# Patient Record
Sex: Male | Born: 1965 | Race: White | Hispanic: No | Smoking: Former smoker
Health system: Southern US, Community
[De-identification: ages and names within clinical notes are randomized; demographics above are authoritative.]

## PROBLEM LIST (undated history)

## (undated) DIAGNOSIS — F1010 Alcohol abuse, uncomplicated: Secondary | ICD-10-CM

## (undated) DIAGNOSIS — I10 Essential (primary) hypertension: Secondary | ICD-10-CM

## (undated) DIAGNOSIS — N183 Chronic kidney disease, stage 3 unspecified: Secondary | ICD-10-CM

## (undated) DIAGNOSIS — I25119 Atherosclerotic heart disease of native coronary artery with unspecified angina pectoris: Secondary | ICD-10-CM

## (undated) DIAGNOSIS — E118 Type 2 diabetes mellitus with unspecified complications: Secondary | ICD-10-CM

## (undated) DIAGNOSIS — I350 Nonrheumatic aortic (valve) stenosis: Secondary | ICD-10-CM

## (undated) DIAGNOSIS — I255 Ischemic cardiomyopathy: Secondary | ICD-10-CM

## (undated) DIAGNOSIS — Z951 Presence of aortocoronary bypass graft: Secondary | ICD-10-CM

## (undated) DIAGNOSIS — I70219 Atherosclerosis of native arteries of extremities with intermittent claudication, unspecified extremity: Secondary | ICD-10-CM

## (undated) DIAGNOSIS — E785 Hyperlipidemia, unspecified: Secondary | ICD-10-CM

## (undated) DIAGNOSIS — E119 Type 2 diabetes mellitus without complications: Secondary | ICD-10-CM

## (undated) HISTORY — DX: Type 2 diabetes mellitus with unspecified complications: E11.8

## (undated) HISTORY — PX: NASAL FRACTURE SURGERY: SHX718

## (undated) HISTORY — PX: BACK SURGERY: SHX140

## (undated) HISTORY — PX: CORONARY ARTERY BYPASS GRAFT: SHX141

## (undated) HISTORY — DX: Atherosclerotic heart disease of native coronary artery with unspecified angina pectoris: I25.119

## (undated) HISTORY — DX: Chronic kidney disease, stage 3 (moderate): N18.3

## (undated) HISTORY — DX: Atherosclerosis of native arteries of extremities with intermittent claudication, unspecified extremity: I70.219

## (undated) HISTORY — PX: TONSILLECTOMY: SUR1361

## (undated) HISTORY — DX: Ischemic cardiomyopathy: I25.5

## (undated) HISTORY — DX: Chronic kidney disease, stage 3 unspecified: N18.30

## (undated) HISTORY — DX: Hyperlipidemia, unspecified: E78.5

## (undated) HISTORY — DX: Presence of aortocoronary bypass graft: Z95.1

## (undated) HISTORY — PX: WISDOM TOOTH EXTRACTION: SHX21

## (undated) HISTORY — DX: Nonrheumatic aortic (valve) stenosis: I35.0

## (undated) HISTORY — PX: COLONOSCOPY: SHX174

## (undated) HISTORY — DX: Essential (primary) hypertension: I10

## (undated) HISTORY — DX: Alcohol abuse, uncomplicated: F10.10

---

## 2002-03-31 ENCOUNTER — Emergency Department (HOSPITAL_COMMUNITY): Admission: EM | Admit: 2002-03-31 | Discharge: 2002-04-01 | Payer: Self-pay | Admitting: Emergency Medicine

## 2002-04-01 ENCOUNTER — Encounter: Payer: Self-pay | Admitting: Emergency Medicine

## 2005-12-06 ENCOUNTER — Emergency Department: Payer: Self-pay

## 2006-07-16 ENCOUNTER — Ambulatory Visit: Payer: Self-pay | Admitting: Pain Medicine

## 2006-07-25 ENCOUNTER — Ambulatory Visit: Payer: Self-pay | Admitting: Pain Medicine

## 2006-07-26 ENCOUNTER — Ambulatory Visit: Payer: Self-pay | Admitting: Pain Medicine

## 2006-08-09 ENCOUNTER — Encounter: Payer: Self-pay | Admitting: Pain Medicine

## 2006-08-13 ENCOUNTER — Ambulatory Visit: Payer: Self-pay | Admitting: Pain Medicine

## 2006-08-24 ENCOUNTER — Encounter: Payer: Self-pay | Admitting: Pain Medicine

## 2006-09-04 ENCOUNTER — Ambulatory Visit: Payer: Self-pay | Admitting: Pain Medicine

## 2006-11-18 ENCOUNTER — Ambulatory Visit: Payer: Self-pay

## 2010-08-09 ENCOUNTER — Ambulatory Visit: Payer: Self-pay | Admitting: Cardiology

## 2011-10-23 ENCOUNTER — Ambulatory Visit: Payer: Self-pay | Admitting: Pain Medicine

## 2011-11-07 ENCOUNTER — Ambulatory Visit: Payer: Self-pay | Admitting: Pain Medicine

## 2011-11-22 ENCOUNTER — Ambulatory Visit: Payer: Self-pay | Admitting: Pain Medicine

## 2013-05-17 ENCOUNTER — Emergency Department: Payer: Self-pay | Admitting: Emergency Medicine

## 2013-06-19 ENCOUNTER — Other Ambulatory Visit: Payer: Self-pay | Admitting: Allergy

## 2013-06-19 ENCOUNTER — Ambulatory Visit
Admission: RE | Admit: 2013-06-19 | Discharge: 2013-06-19 | Disposition: A | Discharge status: Home / Self Care | Payer: 59 | Source: Ambulatory Visit | Attending: Allergy | Admitting: Allergy

## 2013-06-19 DIAGNOSIS — T783XXA Angioneurotic edema, initial encounter: Secondary | ICD-10-CM

## 2013-06-23 DIAGNOSIS — Z951 Presence of aortocoronary bypass graft: Secondary | ICD-10-CM | POA: Insufficient documentation

## 2013-06-23 DIAGNOSIS — E785 Hyperlipidemia, unspecified: Secondary | ICD-10-CM | POA: Insufficient documentation

## 2013-11-26 ENCOUNTER — Ambulatory Visit: Payer: Self-pay | Admitting: Pain Medicine

## 2013-12-01 ENCOUNTER — Ambulatory Visit: Payer: Self-pay | Admitting: Pain Medicine

## 2013-12-10 ENCOUNTER — Ambulatory Visit: Payer: Self-pay | Admitting: Pain Medicine

## 2013-12-26 DIAGNOSIS — E118 Type 2 diabetes mellitus with unspecified complications: Secondary | ICD-10-CM | POA: Insufficient documentation

## 2013-12-26 DIAGNOSIS — R0683 Snoring: Secondary | ICD-10-CM | POA: Insufficient documentation

## 2013-12-26 DIAGNOSIS — I25119 Atherosclerotic heart disease of native coronary artery with unspecified angina pectoris: Secondary | ICD-10-CM | POA: Insufficient documentation

## 2013-12-26 DIAGNOSIS — I2510 Atherosclerotic heart disease of native coronary artery without angina pectoris: Secondary | ICD-10-CM | POA: Insufficient documentation

## 2013-12-26 DIAGNOSIS — F1010 Alcohol abuse, uncomplicated: Secondary | ICD-10-CM | POA: Insufficient documentation

## 2014-03-30 ENCOUNTER — Ambulatory Visit: Payer: Self-pay | Admitting: Unknown Physician Specialty

## 2014-05-18 LAB — SURGICAL PATHOLOGY

## 2015-09-23 DIAGNOSIS — I70219 Atherosclerosis of native arteries of extremities with intermittent claudication, unspecified extremity: Secondary | ICD-10-CM | POA: Insufficient documentation

## 2015-09-23 DIAGNOSIS — Z0000 Encounter for general adult medical examination without abnormal findings: Secondary | ICD-10-CM | POA: Insufficient documentation

## 2017-09-05 DIAGNOSIS — I255 Ischemic cardiomyopathy: Secondary | ICD-10-CM | POA: Insufficient documentation

## 2017-09-05 DIAGNOSIS — I350 Nonrheumatic aortic (valve) stenosis: Secondary | ICD-10-CM | POA: Insufficient documentation

## 2018-03-06 ENCOUNTER — Other Ambulatory Visit: Payer: Self-pay | Admitting: Internal Medicine

## 2018-03-06 DIAGNOSIS — I10 Essential (primary) hypertension: Secondary | ICD-10-CM

## 2018-03-06 DIAGNOSIS — N183 Chronic kidney disease, stage 3 unspecified: Secondary | ICD-10-CM | POA: Insufficient documentation

## 2018-03-19 ENCOUNTER — Ambulatory Visit
Admission: RE | Admit: 2018-03-19 | Discharge: 2018-03-19 | Disposition: A | Discharge status: Home / Self Care | Payer: 59 | Source: Ambulatory Visit | Attending: Internal Medicine | Admitting: Internal Medicine

## 2018-03-19 DIAGNOSIS — I10 Essential (primary) hypertension: Secondary | ICD-10-CM | POA: Diagnosis present

## 2018-04-02 ENCOUNTER — Other Ambulatory Visit: Payer: Self-pay

## 2018-04-02 ENCOUNTER — Encounter (INDEPENDENT_AMBULATORY_CARE_PROVIDER_SITE_OTHER): Payer: Self-pay | Admitting: Vascular Surgery

## 2018-04-02 ENCOUNTER — Ambulatory Visit (INDEPENDENT_AMBULATORY_CARE_PROVIDER_SITE_OTHER): Payer: 59 | Admitting: Vascular Surgery

## 2018-04-02 VITALS — BP 150/66 | HR 56 | Resp 10 | Ht 72.0 in | Wt 234.0 lb

## 2018-04-02 DIAGNOSIS — Z87891 Personal history of nicotine dependence: Secondary | ICD-10-CM

## 2018-04-02 DIAGNOSIS — Z79899 Other long term (current) drug therapy: Secondary | ICD-10-CM

## 2018-04-02 DIAGNOSIS — E1122 Type 2 diabetes mellitus with diabetic chronic kidney disease: Secondary | ICD-10-CM | POA: Diagnosis not present

## 2018-04-02 DIAGNOSIS — E785 Hyperlipidemia, unspecified: Secondary | ICD-10-CM | POA: Diagnosis not present

## 2018-04-02 DIAGNOSIS — N183 Chronic kidney disease, stage 3 unspecified: Secondary | ICD-10-CM

## 2018-04-02 DIAGNOSIS — I701 Atherosclerosis of renal artery: Secondary | ICD-10-CM | POA: Insufficient documentation

## 2018-04-02 DIAGNOSIS — E119 Type 2 diabetes mellitus without complications: Secondary | ICD-10-CM | POA: Insufficient documentation

## 2018-04-02 DIAGNOSIS — I129 Hypertensive chronic kidney disease with stage 1 through stage 4 chronic kidney disease, or unspecified chronic kidney disease: Secondary | ICD-10-CM | POA: Diagnosis not present

## 2018-04-02 DIAGNOSIS — I10 Essential (primary) hypertension: Secondary | ICD-10-CM | POA: Insufficient documentation

## 2018-04-02 NOTE — Assessment & Plan Note (Signed)
The findings on this duplex were of borderline elevated velocities in the right renal artery consistent with a possible moderate right renal artery stenosis with more normal velocities in the left renal artery.   We had a long conversation today about the natural history and pathophysiology of renal artery stenosis.  There had been suggestion of getting an MRA or a CT angiogram but these would be costly studies that would not change our clinical course at this point.  He sounds like he is mildly symptomatic from his renal artery stenosis, and is certainly not critical by the previous duplex findings.  I have some concern about the accuracy of that duplex.  He is not really interested in any intervention at current which is reasonable given the subcritical nature of the lesion and his blood pressure and renal function being reasonably well controlled.  I have asked him to keep a blood pressure diary.  I have discussed the importance of blood pressure control and maintaining renal function.  I will plan to see the patient back in 3 to 4 months with a duplex for follow-up of his renal artery stenosis to assess for progression as well as to continue to monitor the disease.  The patient voices his understanding and is agreeable with our plan of care.

## 2018-04-02 NOTE — Patient Instructions (Signed)
Renal Artery Stenosis Renal artery stenosis (RAS) is a narrowing of the artery that carries blood to the kidneys. It can affect one or both kidneys. The kidneys filter waste and extra fluid from the blood. Waste and fluid are then removed when a person passes urine. The kidneys also make an important chemical messenger (hormone) called renin. Renin helps regulate blood pressure. The first sign of RAS may be high blood pressure. Other symptoms can develop over time. What are the causes? A common cause of this condition is plaque buildup in your arteries (atherosclerosis). The plaques that cause this are made up of:  Fat.  Cholesterol.  Calcium.  Other substances. As these substances build up in your renal artery, the blood supply to your kidneys slows. The lack of blood and oxygen causes the signs and symptoms of RAS. A much less common cause of RAS is a disease called fibromuscular dysplasia. This disease causes abnormal cell growth that narrows the renal artery. It is not related to atherosclerosis. It occurs mostly in women who are 25-50 years old. It may be passed down through families.  What increases the risk? You are more likely to develop this condition if you:  Are a man who is at least 55 years old.  Are a woman who is at least 55 years old.  Have high blood pressure.  Have high cholesterol.  Are a smoker.  Abuse alcohol.  Have diabetes or prediabetes.  Are overweight or obese.  Have a family history of early heart disease. What are the signs or symptoms? RAS usually develops slowly. You may not have any signs or symptoms at first. Early signs may include:  Development of high blood pressure.  A sudden increase in existing high blood pressure.  No longer responding to medicine that used to control your blood pressure. Later signs and symptoms are due to kidney damage. They may include:  Feeling tired (fatigue).  Shortness of breath.  Swollen legs and  feet.  Dry skin.  Headaches.  Muscle cramps.  Loss of appetite.  Nausea or vomiting. How is this diagnosed? This condition may be diagnosed based on:  Your symptoms and medical history. Your health care provider may suspect RAS based on changes in your blood pressure and your risk factors.  A physical exam. During the exam, your health care provider will use a stethoscope to listen for a whooshing sound (bruit) that can occur where the renal artery is blocking blood flow.  Various tests. These may include: ? Blood and urine tests to check your kidney function. ? Imaging tests of your kidneys, such as:  A test that uses sound waves to create an image of your kidneys and the blood flow to your kidneys (ultrasound).  A test in which dye is injected into one of your blood vessels so images can be taken as the dye flows through your renal arteries (angiogram). This can be done using X-rays, a CT scan (computed tomography angiogram, CTA), or a type of MRI (magnetic resonance angiogram, MRA). How is this treated? Making lifestyle changes to reduce your risk factors is the first treatment option for early RAS. If the blood flow to one of your kidneys is cut by more than half, you may need medicine to:  Lower your blood pressure. This is the main medical treatment for RAS. You may need more than one type of medicine for this. The types that work best for people with RAS are: ? ACE inhibitors. ? Angiotensin receptor   blockers.  Reduce fluid in the body (diuretics).  Lower your cholesterol (statins). If medicine is not enough to control RAS, you may need surgery. This may involve:  Threading a tube with an inflatable balloon into the renal artery to force it to open (angioplasty).  Removing plaque from inside the artery (endarterectomy). Follow these instructions at home:  Lifestyle  Make any lifestyle changes recommended by your health care provider. This may include: ? Working with  a dietitian to maintain a heart-healthy diet. This type of diet is low in saturated fat, salt, and added sugar. ? Starting an exercise program as directed by your health care provider. ? Maintaining a healthy weight. ? Quitting smoking. ? Not abusing alcohol. General instructions  Take over-the-counter and prescription medicines only as told by your health care provider.  Keep all follow-up visits as told by your health care provider. This is important. Contact a health care provider if:  Your symptoms of RAS are not getting better.  Your symptoms are changing or getting worse. Get help right away if you have:  Very bad pain in your back or abdomen.  Blood in your urine. Summary  Renal artery stenosis (RAS) is a narrowing of the artery that carries blood to the kidneys. It can affect one or both kidneys.  RAS usually develops slowly. You may not have any signs or symptoms at first, but high blood pressure that is difficult to control is a key symptom.  Making lifestyle changes to reduce your risk factors is the first treatment option for early RAS. If the blood flow to one of your kidneys is cut by more than half, you may need medicines to help manage your cholesterol and blood pressure. This information is not intended to replace advice given to you by your health care provider. Make sure you discuss any questions you have with your health care provider. Document Released: 10/05/2004 Document Revised: 02/05/2017 Document Reviewed: 02/05/2017 Elsevier Interactive Patient Education  2019 [employer]  

## 2018-04-02 NOTE — Assessment & Plan Note (Signed)
lipid control important in reducing the progression of atherosclerotic disease. Continue statin therapy  

## 2018-04-02 NOTE — Assessment & Plan Note (Signed)
blood glucose control important in reducing the progression of atherosclerotic disease. Also, involved in wound healing. On appropriate medications.  

## 2018-04-02 NOTE — Progress Notes (Signed)
Patient ID: Charles Ibarra, male   DOB: 05-23-1965, 53 y.o.   MRN: [id number]  Chief Complaint  Patient presents with  . New Patient (Initial Visit)    HPI Charles Ibarra is a 53 y.o. male.  I am asked to see the patient by Dr. Ouida Sills for evaluation of renal artery stenosis.  He has been noted to have some mild chronic kidney disease in the stage II-III range as well as some blood pressure with suboptimal control.  He says his blood pressure is not markedly elevated but has been somewhat elevated.  Today it is 932 systolic.  There was a notice of some worsening creatinine after losartan which was 1 of the medicines used for his blood pressure previously.  He has a long history of heart disease and multiple medical issues as listed below.  This prompted a renal artery duplex done at the hospital which has not previously done renal artery duplex and the patient says the technologist was very frustrated and had a difficult time doing the study.  The findings on this duplex were of borderline elevated velocities in the right renal artery consistent with a possible moderate right renal artery stenosis with more normal velocities in the left renal artery.  With this, the patient is referred for further evaluation and treatment   Past Medical History:  Diagnosis Date  . Alcohol abuse   . Aortic stenosis   . Atherosclerotic PVD with intermittent claudication ([place])   . Chronic kidney disease, stage 3 (moderate) (HCC)   . Controlled type 2 diabetes mellitus with complication, without long-term current use of insulin ([place])   . Coronary artery disease involving native coronary artery of native heart with angina pectoris ([place])   . HTN (hypertension)   . Hyperlipidemia   . Ischemic cardiomyopathy   . S/P CABG (coronary artery bypass graft)     Past Surgical History Coronary artery bypass grafting   Family History No bleeding disorders, clotting disorders, porphyria, or aneurysms.  Social  History Social History   Tobacco Use  . Smoking status: Former Smoker    Last attempt to quit: 04/02/2010    Years since quitting: 8.0  . Smokeless tobacco: Never Used  Substance Use Topics  . Alcohol use: Yes    Alcohol/week: 13.0 standard drinks    Types: 13 Cans of beer per week  . Drug use: Not Currently    No Known Allergies  Current Outpatient Medications  Medication Sig Dispense Refill  . aspirin EC 81 MG tablet Take 81 mg by mouth daily.    Marland Kitchen atorvastatin (LIPITOR) 80 MG tablet Take 80 mg by mouth daily.    . carvedilol (COREG) 12.5 MG tablet Take 12.5 mg by mouth 2 (two) times daily with a meal.    . levocetirizine (XYZAL) 5 MG tablet Take 5 mg by mouth every evening.    Marland Kitchen losartan (COZAAR) 100 MG tablet Take 100 mg by mouth daily.    Marland Kitchen torsemide (DEMADEX) 20 MG tablet Take 20 mg by mouth daily.     No current facility-administered medications for this visit.       REVIEW OF SYSTEMS (Negative unless checked)  Constitutional: [] Weight loss  [] Fever  [] Chills Cardiac: [] Chest pain   [] Chest pressure   [] Palpitations   [] Shortness of breath when laying flat   [] Shortness of breath at rest   [x] Shortness of breath with exertion. Vascular:  [] Pain in legs with walking   [] Pain in legs at rest   []   Pain in legs when laying flat   [x] Claudication   [] Pain in feet when walking  [] Pain in feet at rest  [] Pain in feet when laying flat   [] History of DVT   [] Phlebitis   [] Swelling in legs   [] Varicose veins   [] Non-healing ulcers Pulmonary:   [] Uses home oxygen   [] Productive cough   [] Hemoptysis   [] Wheeze  [] COPD   [] Asthma Neurologic:  [] Dizziness  [] Blackouts   [] Seizures   [] History of stroke   [] History of TIA  [] Aphasia   [] Temporary blindness   [] Dysphagia   [] Weakness or numbness in arms   [] Weakness or numbness in legs Musculoskeletal:  [] Arthritis   [] Joint swelling   [] Joint pain   [] Low back pain Hematologic:  [] Easy bruising  [] Easy bleeding   [] Hypercoagulable state    [] Anemic  [] Hepatitis Gastrointestinal:  [] Blood in stool   [] Vomiting blood  [] Gastroesophageal reflux/heartburn   [] Abdominal pain Genitourinary:  [x] Chronic kidney disease   [] Difficult urination  [] Frequent urination  [] Burning with urination   [] Hematuria Skin:  [] Rashes   [] Ulcers   [] Wounds Psychological:  [] History of anxiety   []  History of major depression.    Physical Exam BP (!) 150/66 (BP Location: Left Arm, Patient Position: Sitting, Cuff Size: Large)   Pulse (!) 56   Resp 10   Ht 6' (1.829 m)   Wt 234 lb (106.1 kg)   BMI 31.74 kg/m  Gen:  WD/WN, NAD Head: [place]/AT, No temporalis wasting.  Ear/Nose/Throat: Hearing grossly intact, nares w/o erythema or drainage, oropharynx w/o Erythema/Exudate Eyes: Conjunctiva clear, sclera non-icteric  Neck: trachea midline.  No JVD.  Pulmonary:  Good air movement, respirations not labored, no use of accessory muscles  Cardiac: RRR, no JVD Vascular:  Vessel Right Left  Radial Palpable Palpable                                   Gastrointestinal:. No masses, surgical incisions, or scars.  No abdominal bruit Musculoskeletal: M/S 5/5 throughout.  Extremities without ischemic changes.  No deformity or atrophy. No edema. Neurologic: Sensation grossly intact in extremities.  Symmetrical.  Speech is fluent. Motor exam as listed above. Psychiatric: Judgment intact, Mood & affect appropriate for pt's clinical situation. Dermatologic: No rashes or ulcers noted.  No cellulitis or open wounds.    Radiology US Renal Artery Duplex Complete  Result Date: 03/19/2018 CLINICAL DATA:  Hypertension, diabetes EXAM: RENAL/URINARY TRACT ULTRASOUND RENAL DUPLEX DOPPLER ULTRASOUND COMPARISON:  None. FINDINGS: Right Kidney: Length: 10.2 cm. Echogenicity within normal limits. No mass or hydronephrosis visualized. Left Kidney: Length: 10.6 cm. Echogenicity within normal limits. No mass or hydronephrosis visualized. Bladder:  Unremarkable RENAL DUPLEX  ULTRASOUND Right Renal Artery Velocities: Origin:  168 cm/sec Mid:  200 cm/sec [place]:  203 cm/sec Interlobar:  50 cm/sec [place]:  40 cm/sec Left Renal Artery Velocities: Origin:  65 cm/sec Mid:  94 cm/sec [place]:  139 cm/sec Interlobar:  65 cm/sec Arcuate:  36 cm/sec Aortic Velocity:  91 cm/sec Right Renal-Aortic Ratios: Origin: 1.8 Mid:  2.1 [place]: 2.2 Interlobar: 0.6 Arcuate: 0.4 Left Renal-Aortic Ratios: Origin: 0.2 Mid: 1.0 [place]: 1.5 Interlobar: 0.7 Arcuate: 0.4 IMPRESSION: 1. Elevated peak systolic velocities in the right renal artery suggesting stenosis of possible hemodynamic significance. If there is continued clinical concern, renal MRA (lower radiation risk, can be performed noncontrast in the setting of renal dysfunction) and CTA ( higher spatial resolution) represent  more accurate studies, which are additionally more sensitive to the detection of duplicated renal arteries. Electronically Signed   By: Lucrezia Europe M.D.   On: 03/19/2018 13:12    Labs No results found for this or any previous visit (from the past 2160 hour(s)).  Assessment/Plan:  Hyperlipidemia lipid control important in reducing the progression of atherosclerotic disease. Continue statin therapy   Essential hypertension blood pressure control important in reducing the progression of atherosclerotic disease. On appropriate oral medications.   Diabetes ([place]) blood glucose control important in reducing the progression of atherosclerotic disease. Also, involved in wound healing. On appropriate medications.   Renal artery stenosis (HCC) The findings on this duplex were of borderline elevated velocities in the right renal artery consistent with a possible moderate right renal artery stenosis with more normal velocities in the left renal artery.   We had a long conversation today about the natural history and pathophysiology of renal artery stenosis.  There had been suggestion of getting an MRA or a CT angiogram but these  would be costly studies that would not change our clinical course at this point.  He sounds like he is mildly symptomatic from his renal artery stenosis, and is certainly not critical by the previous duplex findings.  I have some concern about the accuracy of that duplex.  He is not really interested in any intervention at current which is reasonable given the subcritical nature of the lesion and his blood pressure and renal function being reasonably well controlled.  I have asked him to keep a blood pressure diary.  I have discussed the importance of blood pressure control and maintaining renal function.  I will plan to see the patient back in 3 to 4 months with a duplex for follow-up of his renal artery stenosis to assess for progression as well as to continue to monitor the disease.  The patient voices his understanding and is agreeable with our plan of care.      Leotis Pain 04/02/2018, 11:01 AM   This note was created with Dragon medical transcription system.  Any errors from dictation are unintentional.

## 2018-04-02 NOTE — Assessment & Plan Note (Signed)
blood pressure control important in reducing the progression of atherosclerotic disease. On appropriate oral medications.  

## 2018-08-02 ENCOUNTER — Other Ambulatory Visit: Payer: Self-pay

## 2018-08-02 ENCOUNTER — Ambulatory Visit (INDEPENDENT_AMBULATORY_CARE_PROVIDER_SITE_OTHER): Payer: 59

## 2018-08-02 ENCOUNTER — Encounter (INDEPENDENT_AMBULATORY_CARE_PROVIDER_SITE_OTHER): Payer: Self-pay | Admitting: Vascular Surgery

## 2018-08-02 ENCOUNTER — Encounter (INDEPENDENT_AMBULATORY_CARE_PROVIDER_SITE_OTHER): Payer: Self-pay

## 2018-08-02 ENCOUNTER — Ambulatory Visit (INDEPENDENT_AMBULATORY_CARE_PROVIDER_SITE_OTHER): Payer: 59 | Admitting: Vascular Surgery

## 2018-08-02 VITALS — BP 151/78 | HR 49 | Resp 12 | Ht 72.0 in | Wt 231.0 lb

## 2018-08-02 DIAGNOSIS — Z87891 Personal history of nicotine dependence: Secondary | ICD-10-CM

## 2018-08-02 DIAGNOSIS — Z79899 Other long term (current) drug therapy: Secondary | ICD-10-CM

## 2018-08-02 DIAGNOSIS — I701 Atherosclerosis of renal artery: Secondary | ICD-10-CM

## 2018-08-02 DIAGNOSIS — I10 Essential (primary) hypertension: Secondary | ICD-10-CM

## 2018-08-02 DIAGNOSIS — E785 Hyperlipidemia, unspecified: Secondary | ICD-10-CM

## 2018-08-02 DIAGNOSIS — E1122 Type 2 diabetes mellitus with diabetic chronic kidney disease: Secondary | ICD-10-CM

## 2018-08-02 DIAGNOSIS — N183 Chronic kidney disease, stage 3 unspecified: Secondary | ICD-10-CM

## 2018-08-02 DIAGNOSIS — I120 Hypertensive chronic kidney disease with stage 5 chronic kidney disease or end stage renal disease: Secondary | ICD-10-CM

## 2018-08-02 NOTE — Assessment & Plan Note (Signed)
His duplex today shows normal velocities and flow in the right renal artery with moderately elevated velocities consistent with a likely greater than 60% stenosis in the left renal artery. He has pretty good blood pressure control on 2 medications.  At this time, he has what would appear to be at least a moderate left renal artery stenosis but not severe symptoms.  I think it would be reasonable to continue to monitor this and try to manage it medically.  Should he develop worsening renal insufficiency or worsening hypertension, consideration for intervention would be given.  I will plan to see him back in 6 months.

## 2018-08-02 NOTE — Progress Notes (Signed)
MRN : [id number]  Charles Ibarra is a 53 y.o. (1965-07-29) male who presents with chief complaint of  Chief Complaint  Patient presents with  . Follow-up    ultrasound  .  History of Present Illness: Patient returns today in follow up of his renal artery stenosis.  He brings his blood pressure diary and by enlarge his blood pressures are pretty good mostly in the 130s over 70s.  There are occasional pressures in the 140s with a couple in the low 150s.  He says his renal function was within one-point of normal at its last check and he has no other complaints today.  His duplex today shows normal velocities and flow in the right renal artery with moderately elevated velocities consistent with a likely greater than 60% stenosis in the left renal artery.  Current Outpatient Medications  Medication Sig Dispense Refill  . aspirin EC 81 MG tablet Take 81 mg by mouth daily.    Marland Kitchen atorvastatin (LIPITOR) 80 MG tablet Take 80 mg by mouth daily.    . carvedilol (COREG) 12.5 MG tablet Take 12.5 mg by mouth 2 (two) times daily with a meal.    . EPINEPHrine (EPIPEN 2-PAK) 0.3 mg/0.3 mL IJ SOAJ injection     . levocetirizine (XYZAL) 5 MG tablet Take 5 mg by mouth every evening.    Marland Kitchen losartan (COZAAR) 100 MG tablet Take 100 mg by mouth daily.    . nitroGLYCERIN (NITROSTAT) 0.4 MG SL tablet Place under the tongue.    . torsemide (DEMADEX) 20 MG tablet Take 20 mg by mouth daily.     No current facility-administered medications for this visit.     Past Medical History:  Diagnosis Date  . Alcohol abuse   . Aortic stenosis   . Atherosclerotic PVD with intermittent claudication ([place])   . Chronic kidney disease, stage 3 (moderate) (HCC)   . Controlled type 2 diabetes mellitus with complication, without long-term current use of insulin ([place])   . Coronary artery disease involving native coronary artery of native heart with angina pectoris ([place])   . HTN (hypertension)   . Hyperlipidemia   . Ischemic  cardiomyopathy   . S/P CABG (coronary artery bypass graft)    Past Surgical History Coronary artery bypass grafting   Family History No bleeding disorders, clotting disorders, porphyria, or aneurysms.  Social History Social History        Tobacco Use  . Smoking status: Former Smoker    Last attempt to quit: 04/02/2010    Years since quitting: 8.0  . Smokeless tobacco: Never Used  Substance Use Topics  . Alcohol use: Yes    Alcohol/week: 13.0 standard drinks    Types: 13 Cans of beer per week  . Drug use: Not Currently    No Known Allergies        Current Outpatient Medications  Medication Sig Dispense Refill  . aspirin EC 81 MG tablet Take 81 mg by mouth daily.    Marland Kitchen atorvastatin (LIPITOR) 80 MG tablet Take 80 mg by mouth daily.    . carvedilol (COREG) 12.5 MG tablet Take 12.5 mg by mouth 2 (two) times daily with a meal.    . levocetirizine (XYZAL) 5 MG tablet Take 5 mg by mouth every evening.    Marland Kitchen losartan (COZAAR) 100 MG tablet Take 100 mg by mouth daily.    Marland Kitchen torsemide (DEMADEX) 20 MG tablet Take 20 mg by mouth daily.     No current facility-administered  medications for this visit.       REVIEW OF SYSTEMS (Negative unless checked)  Constitutional: [] ?Weight loss  [] ?Fever  [] ?Chills Cardiac: [] ?Chest pain   [] ?Chest pressure   [] ?Palpitations   [] ?Shortness of breath when laying flat   [] ?Shortness of breath at rest   [x] ?Shortness of breath with exertion. Vascular:  [] ?Pain in legs with walking   [] ?Pain in legs at rest   [] ?Pain in legs when laying flat   [x] ?Claudication   [] ?Pain in feet when walking  [] ?Pain in feet at rest  [] ?Pain in feet when laying flat   [] ?History of DVT   [] ?Phlebitis   [] ?Swelling in legs   [] ?Varicose veins   [] ?Non-healing ulcers Pulmonary:   [] ?Uses home oxygen   [] ?Productive cough   [] ?Hemoptysis   [] ?Wheeze  [] ?COPD   [] ?Asthma Neurologic:  [] ?Dizziness  [] ?Blackouts   [] ?Seizures   [] ?History of  stroke   [] ?History of TIA  [] ?Aphasia   [] ?Temporary blindness   [] ?Dysphagia   [] ?Weakness or numbness in arms   [] ?Weakness or numbness in legs Musculoskeletal:  [] ?Arthritis   [] ?Joint swelling   [] ?Joint pain   [] ?Low back pain Hematologic:  [] ?Easy bruising  [] ?Easy bleeding   [] ?Hypercoagulable state   [] ?Anemic  [] ?Hepatitis Gastrointestinal:  [] ?Blood in stool   [] ?Vomiting blood  [] ?Gastroesophageal reflux/heartburn   [] ?Abdominal pain Genitourinary:  [x] ?Chronic kidney disease   [] ?Difficult urination  [] ?Frequent urination  [] ?Burning with urination   [] ?Hematuria Skin:  [] ?Rashes   [] ?Ulcers   [] ?Wounds Psychological:  [] ?History of anxiety   [] ? History of major depression.   Physical Examination  BP (!) 151/78   Pulse (!) 49   Resp 12   Ht 6' (1.829 m)   Wt 231 lb (104.8 kg)   BMI 31.33 kg/m  Gen:  WD/WN, NAD Head: [place]/AT, No temporalis wasting. Ear/Nose/Throat: Hearing grossly intact, nares w/o erythema or drainage Eyes: Conjunctiva clear. Sclera non-icteric Neck: Supple.  Trachea midline Pulmonary:  Good air movement, no use of accessory muscles.  Cardiac: RRR, no JVD Vascular:  Vessel Right Left  Radial Palpable Palpable                          PT Palpable Palpable  DP Palpable Palpable   Gastrointestinal: soft, non-tender/non-distended. No guarding/reflex.  Musculoskeletal: M/S 5/5 throughout.  No deformity or atrophy. No edema. Neurologic: Sensation grossly intact in extremities.  Symmetrical.  Speech is fluent.  Psychiatric: Judgment intact, Mood & affect appropriate for pt's clinical situation. Dermatologic: No rashes or ulcers noted.  No cellulitis or open wounds.       Labs No results found for this or any previous visit (from the past 2160 hour(s)).  Radiology No results found.  Assessment/Plan Hyperlipidemia lipid control important in reducing the progression of atherosclerotic disease. Continue statin therapy   Essential  hypertension blood pressure control important in reducing the progression of atherosclerotic disease. On appropriate oral medications.   Diabetes ([place]) blood glucose control important in reducing the progression of atherosclerotic disease. Also, involved in wound healing. On appropriate medications.  Renal artery stenosis (HCC) His duplex today shows normal velocities and flow in the right renal artery with moderately elevated velocities consistent with a likely greater than 60% stenosis in the left renal artery. He has pretty good blood pressure control on 2 medications.  At this time, he has what would appear to be at least a moderate left renal artery stenosis but not severe  symptoms.  I think it would be reasonable to continue to monitor this and try to manage it medically.  Should he develop worsening renal insufficiency or worsening hypertension, consideration for intervention would be given.  I will plan to see him back in 6 months.    Leotis Pain, MD  08/02/2018 10:38 AM    This note was created with Dragon medical transcription system.  Any errors from dictation are purely unintentional

## 2018-10-07 DIAGNOSIS — I701 Atherosclerosis of renal artery: Secondary | ICD-10-CM | POA: Insufficient documentation

## 2019-02-04 ENCOUNTER — Ambulatory Visit (INDEPENDENT_AMBULATORY_CARE_PROVIDER_SITE_OTHER): Payer: 59

## 2019-02-04 ENCOUNTER — Other Ambulatory Visit: Payer: Self-pay

## 2019-02-04 ENCOUNTER — Ambulatory Visit (INDEPENDENT_AMBULATORY_CARE_PROVIDER_SITE_OTHER): Payer: 59 | Admitting: Vascular Surgery

## 2019-02-04 ENCOUNTER — Encounter (INDEPENDENT_AMBULATORY_CARE_PROVIDER_SITE_OTHER): Payer: Self-pay

## 2019-02-04 ENCOUNTER — Encounter (INDEPENDENT_AMBULATORY_CARE_PROVIDER_SITE_OTHER): Payer: Self-pay | Admitting: Vascular Surgery

## 2019-02-04 VITALS — BP 158/80 | HR 60 | Resp 19 | Ht 72.0 in | Wt 235.0 lb

## 2019-02-04 DIAGNOSIS — I701 Atherosclerosis of renal artery: Secondary | ICD-10-CM | POA: Insufficient documentation

## 2019-02-04 DIAGNOSIS — E1122 Type 2 diabetes mellitus with diabetic chronic kidney disease: Secondary | ICD-10-CM

## 2019-02-04 DIAGNOSIS — N183 Chronic kidney disease, stage 3 unspecified: Secondary | ICD-10-CM

## 2019-02-04 DIAGNOSIS — E785 Hyperlipidemia, unspecified: Secondary | ICD-10-CM | POA: Diagnosis not present

## 2019-02-04 DIAGNOSIS — I10 Essential (primary) hypertension: Secondary | ICD-10-CM | POA: Diagnosis not present

## 2019-02-04 NOTE — Progress Notes (Signed)
MRN : [id number]  Charles Ibarra is a 54 y.o. (25-Sep-1965) male who presents with chief complaint of  Chief Complaint  Patient presents with  . Follow-up  .  History of Present Illness: Patient returns today in follow up of his renal artery stenosis.  He says his renal function has been fine and his blood pressure control has been good.  His blood pressure is somewhat elevated today but he says this is much higher than his normal pressure.  His duplex today shows mildly elevated velocities in the right renal artery although it does not appear to be hemodynamically significant.  The renal artery velocities in the left kidney appear to be fairly normal today.  Current Outpatient Medications  Medication Sig Dispense Refill  . aspirin EC 81 MG tablet Take 81 mg by mouth daily.    Marland Kitchen atorvastatin (LIPITOR) 80 MG tablet Take 80 mg by mouth daily.    . carvedilol (COREG) 12.5 MG tablet Take 12.5 mg by mouth 2 (two) times daily with a meal.    . levocetirizine (XYZAL) 5 MG tablet Take 5 mg by mouth every evening.    Marland Kitchen losartan (COZAAR) 100 MG tablet Take 100 mg by mouth daily.    . nitroGLYCERIN (NITROSTAT) 0.4 MG SL tablet Place under the tongue.    . torsemide (DEMADEX) 20 MG tablet Take 20 mg by mouth daily.    Marland Kitchen EPINEPHrine (EPIPEN 2-PAK) 0.3 mg/0.3 mL IJ SOAJ injection      No current facility-administered medications for this visit.    Past Medical History:  Diagnosis Date  . Alcohol abuse   . Aortic stenosis   . Atherosclerotic PVD with intermittent claudication ([place])   . Chronic kidney disease, stage 3 (moderate)   . Controlled type 2 diabetes mellitus with complication, without long-term current use of insulin ([place])   . Coronary artery disease involving native coronary artery of native heart with angina pectoris ([place])   . HTN (hypertension)   . Hyperlipidemia   . Ischemic cardiomyopathy   . S/P CABG (coronary artery bypass graft)       Social History   Tobacco Use  .  Smoking status: Former Smoker    Quit date: 04/02/2010    Years since quitting: 8.8  . Smokeless tobacco: Never Used  Substance Use Topics  . Alcohol use: Yes    Alcohol/week: 13.0 standard drinks    Types: 13 Cans of beer per week  . Drug use: Not Currently    Family History No bleeding or clotting disorders  No Known Allergies   REVIEW OF SYSTEMS(Negative unless checked)  Constitutional: [] ??Weight loss[] ??Fever[] ??Chills Cardiac:[] ??Chest pain[] ??Chest pressure[] ??Palpitations [] ??Shortness of breath when laying flat [] ??Shortness of breath at rest [x] ??Shortness of breath with exertion. Vascular: [] ??Pain in legs with walking[] ??Pain in legsat rest[] ??Pain in legs when laying flat [x] ??Claudication [] ??Pain in feet when walking [] ??Pain in feet at rest [] ??Pain in feet when laying flat [] ??History of DVT [] ??Phlebitis [] ??Swelling in legs [] ??Varicose veins [] ??Non-healing ulcers Pulmonary: [] ??Uses home oxygen [] ??Productive cough[] ??Hemoptysis [] ??Wheeze [] ??COPD [] ??Asthma Neurologic: [] ??Dizziness [] ??Blackouts [] ??Seizures [] ??History of stroke [] ??History of TIA[] ??Aphasia [] ??Temporary blindness[] ??Dysphagia [] ??Weaknessor numbness in arms [] ??Weakness or numbnessin legs Musculoskeletal: [] ??Arthritis [] ??Joint swelling [] ??Joint pain [] ??Low back pain Hematologic:[] ??Easy bruising[] ??Easy bleeding [] ??Hypercoagulable state [] ??Anemic [] ??Hepatitis Gastrointestinal:[] ??Blood in stool[] ??Vomiting blood[] ??Gastroesophageal reflux/heartburn[] ??Abdominal pain Genitourinary: [x] ??Chronic kidney disease [] ??Difficulturination [] ??Frequenturination [] ??Burning with urination[] ??Hematuria Skin: [] ??Rashes [] ??Ulcers [] ??Wounds Psychological: [] ??History of anxiety[] ??History of major depression.  Physical Examination  BP (!) 158/80 (BP Location: Left Arm)    Pulse  60   Resp 19   Ht 6' (1.829 m)   Wt 235 lb (106.6 kg)   BMI 31.87 kg/m  Gen:  WD/WN, NAD Head: [place]/AT, No temporalis wasting. Ear/Nose/Throat: Hearing grossly intact, nares w/o erythema or drainage Eyes: Conjunctiva clear. Sclera non-icteric Neck: Supple.  Trachea midline Pulmonary:  Good air movement, no use of accessory muscles.  Cardiac: RRR, no JVD Vascular:  Vessel Right Left  Radial Palpable Palpable                                   Gastrointestinal: soft, non-tender/non-distended. No guarding/reflex.  Musculoskeletal: M/S 5/5 throughout.  No deformity or atrophy.  No edema. Neurologic: Sensation grossly intact in extremities.  Symmetrical.  Speech is fluent.  Psychiatric: Judgment intact, Mood & affect appropriate for pt's clinical situation. Dermatologic: No rashes or ulcers noted.  No cellulitis or open wounds.       Labs No results found for this or any previous visit (from the past 2160 hour(s)).  Radiology No results found.  Assessment/Plan Hyperlipidemia lipid control important in reducing the progression of atherosclerotic disease. Continue statin therapy   Essential hypertension blood pressure control important in reducing the progression of atherosclerotic disease. On appropriate oral medications.   Diabetes ([place]) blood glucose control important in reducing the progression of atherosclerotic disease. Also, involved in wound healing. On appropriate medications.  Renal artery stenosis (HCC) His duplex today shows mildly elevated velocities in the right renal artery although it does not appear to be hemodynamically significant.  The renal artery velocities in the left kidney appear to be fairly normal today. For his mild, subcritical renal artery stenosis no intervention would currently be recommended.  We will now follow this on an annual basis unless he has worsening clinical symptoms in the interim.    Leotis Pain, MD  02/04/2019  11:11 AM    This note was created with Dragon medical transcription system.  Any errors from dictation are purely unintentional

## 2019-02-04 NOTE — Patient Instructions (Signed)
Renal Artery Stenosis Renal artery stenosis (RAS) is a narrowing of the artery that carries blood to the kidneys. It can affect one or both kidneys. The kidneys filter waste and extra fluid from the blood. Waste and fluid are then removed when a person passes urine. The kidneys also make an important chemical messenger (hormone) called renin. Renin helps regulate blood pressure. The first sign of RAS may be high blood pressure. Other symptoms can develop over time. What are the causes? A common cause of this condition is plaque buildup in your arteries (atherosclerosis). The plaques that cause this are made up of:  Fat.  Cholesterol.  Calcium.  Other substances. As these substances build up in your renal artery, the blood supply to your kidneys slows. The lack of blood and oxygen causes the signs and symptoms of RAS. A much less common cause of RAS is a disease called fibromuscular dysplasia. This disease causes abnormal cell growth that narrows the renal artery. It is not related to atherosclerosis. It occurs mostly in women who are 25-50 years old. It may be passed down through families.  What increases the risk? You are more likely to develop this condition if you:  Are a man who is at least 56 years old.  Are a woman who is at least 55 years old.  Have high blood pressure.  Have high cholesterol.  Are a smoker.  Abuse alcohol.  Have diabetes or prediabetes.  Are overweight or obese.  Have a family history of early heart disease. What are the signs or symptoms? RAS usually develops slowly. You may not have any signs or symptoms at first. Early signs may include:  Development of high blood pressure.  A sudden increase in existing high blood pressure.  No longer responding to medicine that used to control your blood pressure. Later signs and symptoms are due to kidney damage. They may include:  Feeling tired (fatigue).  Shortness of breath.  Swollen legs and  feet.  Dry skin.  Headaches.  Muscle cramps.  Loss of appetite.  Nausea or vomiting. How is this diagnosed? This condition may be diagnosed based on:  Your symptoms and medical history. Your health care provider may suspect RAS based on changes in your blood pressure and your risk factors.  A physical exam. During the exam, your health care provider will use a stethoscope to listen for a whooshing sound (bruit) that can occur where the renal artery is blocking blood flow.  Various tests. These may include: ? Blood and urine tests to check your kidney function. ? Imaging tests of your kidneys, such as:  A test that uses sound waves to create an image of your kidneys and the blood flow to your kidneys (ultrasound).  A test in which dye is injected into one of your blood vessels so images can be taken as the dye flows through your renal arteries (angiogram). This can be done using X-rays, a CT scan (computed tomography angiogram, CTA), or a type of MRI (magnetic resonance angiogram, MRA). How is this treated? Making lifestyle changes to reduce your risk factors is the first treatment option for early RAS. If the blood flow to one of your kidneys is cut by more than half, you may need medicine to:  Lower your blood pressure. This is the main medical treatment for RAS. You may need more than one type of medicine for this. The types that work best for people with RAS are: ? ACE inhibitors. ? Angiotensin receptor   blockers.  Reduce fluid in the body (diuretics).  Lower your cholesterol (statins). If medicine is not enough to control RAS, you may need surgery. This may involve:  Threading a tube with an inflatable balloon into the renal artery to force it to open (angioplasty).  Removing plaque from inside the artery (endarterectomy). Follow these instructions at home:  Lifestyle  Make any lifestyle changes recommended by your health care provider. This may include: ? Working with  a dietitian to maintain a heart-healthy diet. This type of diet is low in saturated fat, salt, and added sugar. ? Starting an exercise program as directed by your health care provider. ? Maintaining a healthy weight. ? Quitting smoking. ? Not abusing alcohol. General instructions  Take over-the-counter and prescription medicines only as told by your health care provider.  Keep all follow-up visits as told by your health care provider. This is important. Contact a health care provider if:  Your symptoms of RAS are not getting better.  Your symptoms are changing or getting worse. Get help right away if you have:  Very bad pain in your back or abdomen.  Blood in your urine. Summary  Renal artery stenosis (RAS) is a narrowing of the artery that carries blood to the kidneys. It can affect one or both kidneys.  RAS usually develops slowly. You may not have any signs or symptoms at first, but high blood pressure that is difficult to control is a key symptom.  Making lifestyle changes to reduce your risk factors is the first treatment option for early RAS. If the blood flow to one of your kidneys is cut by more than half, you may need medicines to help manage your cholesterol and blood pressure. This information is not intended to replace advice given to you by your health care provider. Make sure you discuss any questions you have with your health care provider. Document Revised: 02/05/2017 Document Reviewed: 02/05/2017 Elsevier Patient Education  2020 [employer]  

## 2019-02-04 NOTE — Assessment & Plan Note (Signed)
His duplex today shows mildly elevated velocities in the right renal artery although it does not appear to be hemodynamically significant.  The renal artery velocities in the left kidney appear to be fairly normal today. For his mild, subcritical renal artery stenosis no intervention would currently be recommended.  We will now follow this on an annual basis unless he has worsening clinical symptoms in the interim.

## 2019-04-15 ENCOUNTER — Other Ambulatory Visit (HOSPITAL_COMMUNITY): Payer: Self-pay | Admitting: Acute Care

## 2019-04-15 ENCOUNTER — Other Ambulatory Visit (HOSPITAL_COMMUNITY): Payer: Self-pay | Admitting: Physical Medicine & Rehabilitation

## 2019-04-15 ENCOUNTER — Other Ambulatory Visit: Payer: Self-pay | Admitting: Physical Medicine & Rehabilitation

## 2019-04-15 DIAGNOSIS — M5416 Radiculopathy, lumbar region: Secondary | ICD-10-CM

## 2019-04-19 ENCOUNTER — Ambulatory Visit: Payer: 59

## 2019-04-26 ENCOUNTER — Ambulatory Visit: Payer: 59

## 2019-05-27 DIAGNOSIS — G8929 Other chronic pain: Secondary | ICD-10-CM | POA: Insufficient documentation

## 2019-09-02 ENCOUNTER — Ambulatory Visit
Admission: RE | Admit: 2019-09-02 | Discharge: 2019-09-02 | Disposition: A | Discharge status: Home / Self Care | Payer: 59 | Source: Ambulatory Visit | Attending: Physical Medicine & Rehabilitation | Admitting: Physical Medicine & Rehabilitation

## 2019-09-02 ENCOUNTER — Other Ambulatory Visit: Payer: Self-pay

## 2019-09-02 DIAGNOSIS — M5416 Radiculopathy, lumbar region: Secondary | ICD-10-CM

## 2019-09-02 MED ORDER — GADOBENATE DIMEGLUMINE 529 MG/ML IV SOLN
20.0000 mL | Freq: Once | INTRAVENOUS | Status: AC | PRN
  Administered 2019-09-02: 20 mL via INTRAVENOUS

## 2020-02-03 ENCOUNTER — Ambulatory Visit (INDEPENDENT_AMBULATORY_CARE_PROVIDER_SITE_OTHER): Payer: 59 | Admitting: Nurse Practitioner

## 2020-02-03 ENCOUNTER — Encounter (INDEPENDENT_AMBULATORY_CARE_PROVIDER_SITE_OTHER): Payer: 59

## 2020-12-27 ENCOUNTER — Other Ambulatory Visit: Payer: Self-pay | Admitting: Ophthalmology

## 2020-12-27 DIAGNOSIS — H34232 Retinal artery branch occlusion, left eye: Secondary | ICD-10-CM

## 2020-12-29 ENCOUNTER — Other Ambulatory Visit (INDEPENDENT_AMBULATORY_CARE_PROVIDER_SITE_OTHER): Payer: Self-pay | Admitting: Ophthalmology

## 2020-12-29 DIAGNOSIS — H34232 Retinal artery branch occlusion, left eye: Secondary | ICD-10-CM

## 2020-12-31 ENCOUNTER — Ambulatory Visit (INDEPENDENT_AMBULATORY_CARE_PROVIDER_SITE_OTHER): Payer: Self-pay | Admitting: Nurse Practitioner

## 2020-12-31 ENCOUNTER — Other Ambulatory Visit: Payer: Self-pay

## 2020-12-31 ENCOUNTER — Ambulatory Visit (INDEPENDENT_AMBULATORY_CARE_PROVIDER_SITE_OTHER): Payer: Self-pay

## 2020-12-31 ENCOUNTER — Encounter (INDEPENDENT_AMBULATORY_CARE_PROVIDER_SITE_OTHER): Payer: Self-pay | Admitting: Nurse Practitioner

## 2020-12-31 ENCOUNTER — Ambulatory Visit: Payer: 59

## 2020-12-31 VITALS — BP 145/69 | HR 53 | Resp 16 | Wt 232.0 lb

## 2020-12-31 DIAGNOSIS — I6522 Occlusion and stenosis of left carotid artery: Secondary | ICD-10-CM

## 2020-12-31 DIAGNOSIS — I10 Essential (primary) hypertension: Secondary | ICD-10-CM

## 2020-12-31 DIAGNOSIS — H34232 Retinal artery branch occlusion, left eye: Secondary | ICD-10-CM

## 2020-12-31 DIAGNOSIS — N183 Chronic kidney disease, stage 3 unspecified: Secondary | ICD-10-CM

## 2020-12-31 DIAGNOSIS — E1122 Type 2 diabetes mellitus with diabetic chronic kidney disease: Secondary | ICD-10-CM

## 2020-12-31 DIAGNOSIS — E785 Hyperlipidemia, unspecified: Secondary | ICD-10-CM

## 2021-01-01 NOTE — Progress Notes (Signed)
Subjective:    Patient ID: Charles Ibarra, male    DOB: Jun 09, 1965, 55 y.o.   MRN: [id number] Chief Complaint  Patient presents with   Follow-up    Ultrasound follow up    Charles Ibarra is a 55 year old male that presents today after recent sudden vision changes.  The patient notes that on Sunday he woke up with vision changes in his left eye.  A portion of his vision was cut off.  It has not recovered.  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    There is no recent history of TIA symptoms or focal motor deficits. There is no prior documented CVA.  There is no history of migraine headaches. There is no history of seizures.  The patient is taking enteric-coated aspirin 81 mg daily.  The patient has a history of coronary artery disease, no recent episodes of angina or shortness of breath. The patient denies PAD or claudication symptoms. There is a history of hyperlipidemia which is being treated with a statin.    Today noninvasive studies show a right ICA stenosis of 1 to 39% with a left ICA stenosis of 80 to 99%.  There is also greater than 50% stenosis noted in the distal common carotid artery.  There is severe amounts of calcified irregular plaque in the ICA with significant narrowing seen.   Review of Systems  Eyes:  Positive for visual disturbance.  Neurological:  Negative for dizziness, syncope, facial asymmetry, weakness, light-headedness and headaches.  All other systems reviewed and are negative.     Objective:   Physical Exam Vitals reviewed.  HENT:     Head: Normocephalic.  Neck:     Vascular: No carotid bruit.  Cardiovascular:     Rate and Rhythm: Normal rate and regular rhythm.     Pulses: Normal pulses.  Pulmonary:     Effort: Pulmonary effort is normal.  Neurological:     Mental Status: He is alert and oriented to person, place, and time.  Psychiatric:        Mood and Affect: Mood normal.        Behavior:  Behavior normal.        Thought Content: Thought content normal.        Judgment: Judgment normal.    BP (!) 145/69 (BP Location: Left Arm)   Pulse (!) 53   Resp 16   Wt 232 lb (105.2 kg)   BMI 31.46 kg/m   Past Medical History:  Diagnosis Date   Alcohol abuse    Aortic stenosis    Atherosclerotic PVD with intermittent claudication (HCC)    Chronic kidney disease, stage 3 (moderate)    Controlled type 2 diabetes mellitus with complication, without long-term current use of insulin (HCC)    Coronary artery disease involving native coronary artery of native heart with angina pectoris (HCC)    HTN (hypertension)    Hyperlipidemia    Ischemic cardiomyopathy    S/P CABG (coronary artery bypass graft)     Social History   Socioeconomic History   Marital status: Divorced    Spouse name: Not on file   Number of children: Not on file   Years of education: Not on file   Highest education level: Not on file  Occupational History   Not on file  Tobacco Use   Smoking status: Former    Types: Cigarettes    Quit date: 04/02/2010    Years since quitting:  10.7   Smokeless tobacco: Never  Substance and Sexual Activity   Alcohol use: Yes    Alcohol/week: 13.0 standard drinks    Types: 13 Cans of beer per week   Drug use: Not Currently   Sexual activity: Not Currently  Other Topics Concern   Not on file  Social History Narrative   Not on file   Social Determinants of Health   Financial Resource Strain: Not on file  Food Insecurity: Not on file  Transportation Needs: Not on file  Physical Activity: Not on file  Stress: Not on file  Social Connections: Not on file  Intimate Partner Violence: Not on file    Past Surgical History:  Procedure Laterality Date   BACK SURGERY     COLONOSCOPY     CORONARY ARTERY BYPASS GRAFT     NASAL FRACTURE SURGERY     TONSILLECTOMY     WISDOM TOOTH EXTRACTION      History reviewed. No pertinent family history.  No Known  Allergies  No flowsheet data found.    CMP  No results found for: NA, K, CL, CO2, GLUCOSE, BUN, CREATININE, CALCIUM, PROT, ALBUMIN, AST, ALT, ALKPHOS, BILITOT, GFRNONAA, GFRAA   No results found.     Assessment & Plan:   1. Stenosis of left carotid artery The patient remains asymptomatic with respect to the carotid stenosis.  However, the patient has now progressed and has a lesion the is >70%.  Patient should undergo CT angiography of the carotid arteries to define the degree of stenosis of the internal carotid arteries bilaterally and the anatomic suitability for surgery vs. intervention.  If the patient does indeed need surgery cardiac clearance will be required, once cleared the patient will be scheduled for surgery.  The risks, benefits and alternative therapies were reviewed in detail with the patient.  All questions were answered.  The patient agrees to proceed with imaging.  Currently the patient is self-pay and he has requested imaging to be done at an outside imaging center, [facility], as requested.  Patient is advised if they are unable to complete imaging that we will need to do it at a [facility].  Continue antiplatelet therapy as prescribed. Continue management of CAD, HTN and Hyperlipidemia. Healthy heart diet, encouraged exercise at least 4 times per week.    2. Essential hypertension Continue antihypertensive medications as already ordered, these medications have been reviewed and there are no changes at this time.   3. Hyperlipidemia, unspecified hyperlipidemia type Continue statin as ordered and reviewed, no changes at this time   4. Type 2 diabetes mellitus with stage 3 chronic kidney disease, without long-term current use of insulin, unspecified whether stage 3a or 3b CKD ([place]) Continue hypoglycemic medications as already ordered, these medications have been reviewed and there are no changes at this time.  Hgb A1C to be monitored as already arranged  by primary service    Current Outpatient Medications on File Prior to Visit  Medication Sig Dispense Refill   aspirin EC 81 MG tablet Take 81 mg by mouth daily.     atorvastatin (LIPITOR) 80 MG tablet Take 80 mg by mouth daily.     carvedilol (COREG) 12.5 MG tablet Take 12.5 mg by mouth 2 (two) times daily with a meal.     EPINEPHrine 0.3 mg/0.3 mL IJ SOAJ injection      losartan (COZAAR) 100 MG tablet Take 100 mg by mouth daily.     nitroGLYCERIN (NITROSTAT) 0.4 MG SL  tablet Place under the tongue.     torsemide (DEMADEX) 20 MG tablet Take 20 mg by mouth daily.     levocetirizine (XYZAL) 5 MG tablet Take 5 mg by mouth every evening. (Patient not taking: Reported on 12/31/2020)     No current facility-administered medications on file prior to visit.    There are no Patient Instructions on file for this visit. No follow-ups on file.   Kris Hartmann, NP

## 2021-01-02 ENCOUNTER — Encounter (INDEPENDENT_AMBULATORY_CARE_PROVIDER_SITE_OTHER): Payer: Self-pay | Admitting: Nurse Practitioner

## 2021-01-06 DIAGNOSIS — H34232 Retinal artery branch occlusion, left eye: Secondary | ICD-10-CM | POA: Insufficient documentation

## 2021-01-06 DIAGNOSIS — I6522 Occlusion and stenosis of left carotid artery: Secondary | ICD-10-CM | POA: Insufficient documentation

## 2021-01-11 ENCOUNTER — Other Ambulatory Visit: Payer: Self-pay

## 2021-01-11 ENCOUNTER — Encounter (INDEPENDENT_AMBULATORY_CARE_PROVIDER_SITE_OTHER): Payer: Self-pay | Admitting: Vascular Surgery

## 2021-01-11 ENCOUNTER — Ambulatory Visit (INDEPENDENT_AMBULATORY_CARE_PROVIDER_SITE_OTHER): Payer: Self-pay | Admitting: Vascular Surgery

## 2021-01-11 VITALS — BP 124/73 | HR 60 | Ht 72.0 in | Wt 234.0 lb

## 2021-01-11 DIAGNOSIS — N183 Chronic kidney disease, stage 3 unspecified: Secondary | ICD-10-CM

## 2021-01-11 DIAGNOSIS — E785 Hyperlipidemia, unspecified: Secondary | ICD-10-CM

## 2021-01-11 DIAGNOSIS — H34232 Retinal artery branch occlusion, left eye: Secondary | ICD-10-CM

## 2021-01-11 DIAGNOSIS — I10 Essential (primary) hypertension: Secondary | ICD-10-CM

## 2021-01-11 DIAGNOSIS — I6522 Occlusion and stenosis of left carotid artery: Secondary | ICD-10-CM

## 2021-01-11 DIAGNOSIS — I701 Atherosclerosis of renal artery: Secondary | ICD-10-CM

## 2021-01-11 MED ORDER — CLOPIDOGREL BISULFATE 75 MG PO TABS: 75.0000 mg | ORAL_TABLET | Freq: Every day | ORAL | 6 refills | Status: DC

## 2021-01-11 NOTE — Assessment & Plan Note (Signed)
Represents symptoms from his severe carotid disease on the left

## 2021-01-11 NOTE — Patient Instructions (Signed)
Carotid Angioplasty With Stent Carotid angioplasty with stent is a procedure to open or widen an artery in the neck (carotid artery) that has become narrowed. This is done by inflating a small balloon inside the artery and then placing a small piece of metal that looks like a coil or spring (stent) inside the artery. The stent helps keep the artery open by supporting the artery walls. The carotid arteries supply blood to the brain. When fats, cholesterol, and other materials (plaque) build up in an artery, the artery becomes narrow and can become blocked. This can reduce or block blood flow to certain areas of the brain, which can cause serious health problems, including stroke. Tell a health care provider about: Any allergies you have. All medicines you are taking, including vitamins, herbs, eye drops, creams, and over-the-counter medicines. Any problems you or family members have had with anesthetic medicines. Any blood disorders you have. Any surgeries you have had. Any medical conditions you have. Whether you are pregnant or may be pregnant. What are the risks? Generally, this is a safe procedure. However, problems may occur, including: Infection. Bleeding. Allergic reactions to medicines or dyes. Damage to other structures or organs, or to the carotid artery itself. The carotid artery becoming blocked again. A collection of blood under the skin (hematoma) around the stent site that gets larger. A blood clot in another part of the body. Kidney injury. Stroke. Heart attack. What happens before the procedure? Ask your health care provider about: Changing or stopping your regular medicines. This is especially important if you are taking diabetes medicines or blood thinners. Whether aspirin is recommended before this procedure. Taking over-the-counter medicines, vitamins, herbs, and supplements. Follow instructions from your health care provider about eating or drinking restrictions. Do  not use any products that contain nicotine or tobacco for 4 weeks before the procedure. These products include cigarettes, e-cigarettes, and chewing tobacco. If you need help quitting, ask your health care provider. Ask your health care provider what steps will be taken to help prevent infection. These may include: Removing hair at the surgery site. Washing skin with a germ-killing soap. Taking antibiotic medicine. You may have blood tests and imaging tests done. Plan to have someone take you home from the hospital or clinic. If you will be going home right after the procedure, plan to have someone with you for 24 hours. What happens during the procedure?  An IV will be inserted into one of your veins. You may be given one or more of the following: A medicine to help you relax (sedative). A medicine to numb the area where the catheter will be inserted (local anesthetic). Most commonly, an incision will be made in your groin. In some cases, an incision may be made in your wrist or forearm instead of your groin. A small, thin tube (catheter) will be inserted through your incision, into an artery. The catheter will be threaded upward into your carotid artery. An X-ray machine (fluoroscope) will help your health care provider guide the catheter to the correct place in your artery. Dye will be injected into the catheter and will travel to the narrow or blocked part of your carotid artery. X-ray images will be taken of how the dye flows through your artery. While the images are being taken, you may be given instructions about breathing, swallowing, moving, or talking. A filter (distal protection device) will be inserted into your artery. This will be used to catch plaque that comes loose in your artery  during the procedure. This reduces the risk of plaque moving into your brain. A small balloon will be inserted into your artery. The balloon will be inflated for a few seconds to widen your artery and  will then be removed. The stent will be placed in your artery. A second small balloon will be inserted into your artery and inflated. This expands the stent inside of your artery so that the stent holds up the artery walls. The balloon will then be removed. The catheter and the distal protection device will be removed from your artery. Your incision may be closed with stitches (sutures), skin glue, or adhesive tape. A bandage (dressing) will be placed over your incision. The procedure may vary among health care providers and hospitals. What happens after the procedure? Your blood pressure, heart rate, breathing rate, and blood oxygen level will be monitored until you leave the hospital or clinic. You may continue to receive fluids and medicines through an IV. You may need to have pressure placed on the incision site to prevent bleeding. You will need to keep the area still for a few hours, or as long as directed by your health care provider. If the procedure was done in the groin, you will be instructed not to bend or cross your legs. You may have some pain. Pain medicines will be available to help you. You may have a test that uses sound waves to take pictures (ultrasound) of the carotid artery. This can be compared to future tests to check for changes in the artery. Do not drive for 24 hours. Summary Carotid angioplasty with stent is a procedure to open or widen an artery in the neck (carotid artery) that has become narrowed. The procedure is done to lower the risk of problems that can result from reduced blood flow to the brain, including a stroke. The stent placed inside the artery will help keep the artery open by supporting the artery walls. Follow instructions from your health care provider about taking medicines and about eating and drinking before the procedure. This information is not intended to replace advice given to you by your health care provider. Make sure you discuss any  questions you have with your health care provider. Document Revised: 03/23/2020 Document Reviewed: 03/23/2020 Elsevier Patient Education  [place].

## 2021-01-11 NOTE — Assessment & Plan Note (Signed)
blood pressure control important in reducing the progression of atherosclerotic disease. On appropriate oral medications.  

## 2021-01-11 NOTE — Assessment & Plan Note (Signed)
lipid control important in reducing the progression of atherosclerotic disease. Continue statin therapy  

## 2021-01-11 NOTE — Progress Notes (Signed)
MRN : [id number]  Charles Ibarra is a 55 y.o. ([birth year]) male who presents with chief complaint of  Chief Complaint  Patient presents with   Follow-up    FU from CT  .  History of Present Illness: Patient returns today in follow up of his carotid disease. He has some residual left eye vision issues after his amaurosis fugax episode a couple weeks ago. No new complaints. He has undergone a CT angiogram which I have independently reviewed.  My interpretation of the CT scan would be a stenosis in the moderate range on the right side in the 50 to 60% range.  The left side appears to be a very high-grade stenosis of clearly greater than 80 to 85%.  The vessel has only mild tortuosity and moderate calcification.  The lesion does go up near the angle of the jaw on the left.  Current Outpatient Medications  Medication Sig Dispense Refill   aspirin EC 81 MG tablet Take 81 mg by mouth daily.     atorvastatin (LIPITOR) 80 MG tablet Take 80 mg by mouth daily.     carvedilol (COREG) 12.5 MG tablet Take 12.5 mg by mouth 2 (two) times daily with a meal.     EPINEPHrine 0.3 mg/0.3 mL IJ SOAJ injection      levocetirizine (XYZAL) 5 MG tablet Take 5 mg by mouth every evening.     losartan (COZAAR) 100 MG tablet Take 100 mg by mouth daily.     nitroGLYCERIN (NITROSTAT) 0.4 MG SL tablet Place under the tongue.     torsemide (DEMADEX) 20 MG tablet Take 20 mg by mouth daily.     No current facility-administered medications for this visit.    Past Medical History:  Diagnosis Date   Alcohol abuse    Aortic stenosis    Atherosclerotic PVD with intermittent claudication (HCC)    Chronic kidney disease, stage 3 (moderate)    Controlled type 2 diabetes mellitus with complication, without long-term current use of insulin (HCC)    Coronary artery disease involving native coronary artery of native heart with angina pectoris (HCC)    HTN (hypertension)    Hyperlipidemia    Ischemic cardiomyopathy    S/P  CABG (coronary artery bypass graft)     Past Surgical History:  Procedure Laterality Date   BACK SURGERY     COLONOSCOPY     CORONARY ARTERY BYPASS GRAFT     NASAL FRACTURE SURGERY     TONSILLECTOMY     WISDOM TOOTH EXTRACTION       Social History   Tobacco Use   Smoking status: Former    Types: Cigarettes    Quit date: 04/02/2010    Years since quitting: 10.7   Smokeless tobacco: Never  Substance Use Topics   Alcohol use: Yes    Alcohol/week: 13.0 standard drinks    Types: 13 Cans of beer per week   Drug use: Not Currently      Family History No bleeding disorders, clotting disorders, autoimmune diseases, or aneurysms  No Known Allergies   REVIEW OF SYSTEMS (Negative unless checked)  Constitutional: [] Weight loss  [] Fever  [] Chills Cardiac: [] Chest pain   [] Chest pressure   [] Palpitations   [] Shortness of breath when laying flat   [] Shortness of breath at rest   [x] Shortness of breath with exertion. Vascular:  [] Pain in legs with walking   [] Pain in legs at rest   [] Pain in legs when laying flat   [x] Claudication   []   Pain in feet when walking  [] Pain in feet at rest  [] Pain in feet when laying flat   [] History of DVT   [] Phlebitis   [] Swelling in legs   [] Varicose veins   [] Non-healing ulcers Pulmonary:   [] Uses home oxygen   [] Productive cough   [] Hemoptysis   [] Wheeze  [] COPD   [] Asthma Neurologic:  [] Dizziness  [] Blackouts   [] Seizures   [] History of stroke   [] History of TIA  [] Aphasia   [x] Temporary blindness   [] Dysphagia   [] Weakness or numbness in arms   [] Weakness or numbness in legs Musculoskeletal:  [] Arthritis   [] Joint swelling   [] Joint pain   [] Low back pain Hematologic:  [] Easy bruising  [] Easy bleeding   [] Hypercoagulable state   [] Anemic   Gastrointestinal:  [] Blood in stool   [] Vomiting blood  [] Gastroesophageal reflux/heartburn   [] Abdominal pain Genitourinary:  [x] Chronic kidney disease   [] Difficult urination  [] Frequent urination  [] Burning with  urination   [] Hematuria Skin:  [] Rashes   [] Ulcers   [] Wounds Psychological:  [] History of anxiety   []  History of major depression.  Physical Examination  BP 124/73    Pulse 60    Ht 6' (1.829 m)    Wt 234 lb (106.1 kg)    BMI 31.74 kg/m  Gen:  WD/WN, NAD Head: [place]/AT, No temporalis wasting. Ear/Nose/Throat: Hearing grossly intact, nares w/o erythema or drainage Eyes: Conjunctiva clear. Sclera non-icteric Neck: Supple.  Trachea midline Pulmonary:  Good air movement, no use of accessory muscles.  Cardiac: RRR, no JVD Vascular:  Vessel Right Left  Radial Palpable Palpable           Musculoskeletal: M/S 5/5 throughout.  No deformity or atrophy. No edema. Neurologic: Sensation grossly intact in extremities.  Symmetrical.  Speech is fluent.  Psychiatric: Judgment intact, Mood & affect appropriate for pt's clinical situation. Dermatologic: No rashes or ulcers noted.  No cellulitis or open wounds.      Labs No results found for this or any previous visit (from the past 2160 hour(s)).  Radiology VAS US CAROTID  Result Date: 01/05/2021 Carotid Arterial Duplex Study Patient Name:  Charles Ibarra  Date of Exam:   12/31/2020 Medical Rec #: [id number]         Accession #:    [id number] Date of Birth: November 24, 1965        Patient Gender: M Patient Age:   57 years Exam Location:  [facility] Procedure:      VAS US CAROTID Referring Phys: Keenan Bachelor [place] --------------------------------------------------------------------------------  Indications: Visual disturbance and left retinal artery blockage. Performing Technologist: Concha Norway RVT  Examination Guidelines: A complete evaluation includes B-mode imaging, spectral Doppler, color Doppler, and power Doppler as needed of all accessible portions of each vessel. Bilateral testing is considered an integral part of a complete examination. Limited examinations for reoccurring indications may be performed as noted.  Right Carotid Findings:  +----------+-------+-------+--------+---------------------------------+--------+             PSV     EDV     Stenosis Plaque Description                Comments              cm/s    cm/s                                                         +----------+-------+-------+--------+---------------------------------+--------+  CCA Prox   65      14                                                           +----------+-------+-------+--------+---------------------------------+--------+  CCA Mid    92      22                                                           +----------+-------+-------+--------+---------------------------------+--------+  CCA Distal 70      20                                                           +----------+-------+-------+--------+---------------------------------+--------+  ICA Prox   83      35      1-39%    irregular, calcific and                                                          heterogenous                                +----------+-------+-------+--------+---------------------------------+--------+  ICA Mid    85      23                                                           +----------+-------+-------+--------+---------------------------------+--------+  ICA Distal 82      32                                                           +----------+-------+-------+--------+---------------------------------+--------+  ECA        66      10                                                           +----------+-------+-------+--------+---------------------------------+--------+ +----------+--------+-------+----------------+-------------------+             PSV cm/s EDV cms Describe         Arm Pressure (mmHG)  +----------+--------+-------+----------------+-------------------+  Subclavian 74               Multiphasic, WNL                      +----------+--------+-------+----------------+-------------------+ +---------+--------+--+--------+--+---------+  Vertebral PSV cm/s 37 EDV  cm/s 13 Antegrade  +---------+--------+--+--------+--+---------+ ICA/CCA ratio = .90 Left Carotid Findings: +----------+--------+--------+--------+----------------------+--------+             PSV cm/s EDV cm/s Stenosis Plaque Description     Comments  +----------+--------+--------+--------+----------------------+--------+  CCA Prox   58       12                                                 +----------+--------+--------+--------+----------------------+--------+  CCA Mid    52       11                                                 +----------+--------+--------+--------+----------------------+--------+  CCA Distal 51       8        >50%     heterogenous                     +----------+--------+--------+--------+----------------------+--------+  ICA Prox   329      177      80-99%   calcific and irregular           +----------+--------+--------+--------+----------------------+--------+  ICA Mid    352      113      80-99%                                    +----------+--------+--------+--------+----------------------+--------+  ICA Distal 60       10                                                 +----------+--------+--------+--------+----------------------+--------+  ECA        60                                                          +----------+--------+--------+--------+----------------------+--------+ +----------+--------+--------+----------------+-------------------+             PSV cm/s EDV cm/s Describe         Arm Pressure (mmHG)  +----------+--------+--------+----------------+-------------------+  Subclavian 92                Multiphasic, WNL                      +----------+--------+--------+----------------+-------------------+ +---------+--------+--+--------+--+---------+  Vertebral PSV cm/s 79 EDV cm/s 25 Antegrade  +---------+--------+--+--------+--+---------+   Summary: Right Carotid: Velocities in the right ICA are consistent with a 1-39% stenosis.                Moderate calcified and irregular  plaque in the bulb/ICA. Left Carotid: Velocities in the left ICA are consistent with a 80-99% stenosis.               Hemodynamically significant plaque >50% visualized in the CCA.  Severe amount of calcified irregular plaque in the ICA with               significant narrowing seen. Vertebrals:  Bilateral vertebral arteries demonstrate antegrade flow. Subclavians: Normal flow hemodynamics were seen in bilateral subclavian              arteries. *See table(s) above for measurements and observations.  Electronically signed by Leotis Pain MD on 01/05/2021 at 7:33:30 AM.    Final     Assessment/Plan  Carotid stenosis, left He has undergone a CT angiogram which I have independently reviewed.  My interpretation of the CT scan would be a stenosis in the moderate range on the right side in the 50 to 60% range.  The left side appears to be a very high-grade stenosis of clearly greater than 80 to 85%.  The vessel has only mild tortuosity and moderate calcification.  The lesion does go up near the angle of the jaw on the left. We had a long discussion today regarding the options for treatment.  At this point, he clearly needs to have intervention or surgery to correct the stenosis and reduce his risk of stroke.  I have discussed the differences between carotid endarterectomy and carotid stenting.  I discussed the pros and the cons of each method.  He is adamantly opposed open surgical therapy and would like to have a carotid stent.  His anatomy is not ideal for carotid stenting, but it appears to be acceptable for carotid stenting.  Plavix will be initiated at this time.  This will be scheduled for the near future at his convenience.  Essential hypertension blood pressure control important in reducing the progression of atherosclerotic disease. On appropriate oral medications.   Retinal arterial branch occlusion, left Represents symptoms from his severe carotid disease on the left  Renal artery  stenosis [facility]) Previously has been checked.  Can reassess this after his carotid is treated definitively.  Hyperlipidemia lipid control important in reducing the progression of atherosclerotic disease. Continue statin therapy   Chronic kidney disease (CKD), stage III (moderate) Hydrate and limit contrast with the procedure.    Leotis Pain, MD  01/11/2021 12:03 PM    This note was created with Dragon medical transcription system.  Any errors from dictation are purely unintentional

## 2021-01-11 NOTE — Assessment & Plan Note (Signed)
Previously has been checked.  Can reassess this after his carotid is treated definitively.

## 2021-01-11 NOTE — Assessment & Plan Note (Signed)
He has undergone a CT angiogram which I have independently reviewed.  My interpretation of the CT scan would be a stenosis in the moderate range on the right side in the 50 to 60% range.  The left side appears to be a very high-grade stenosis of clearly greater than 80 to 85%.  The vessel has only mild tortuosity and moderate calcification.  The lesion does go up near the angle of the jaw on the left. We had a long discussion today regarding the options for treatment.  At this point, he clearly needs to have intervention or surgery to correct the stenosis and reduce his risk of stroke.  I have discussed the differences between carotid endarterectomy and carotid stenting.  I discussed the pros and the cons of each method.  He is adamantly opposed open surgical therapy and would like to have a carotid stent.  His anatomy is not ideal for carotid stenting, but it appears to be acceptable for carotid stenting.  Plavix will be initiated at this time.  This will be scheduled for the near future at his convenience.

## 2021-01-11 NOTE — Assessment & Plan Note (Signed)
Hydrate and limit contrast with the procedure.

## 2021-01-11 NOTE — H&P (View-Only) (Signed)
MRN : [id number]  Charles Ibarra is a 55 y.o. (1965/03/02) male who presents with chief complaint of  Chief Complaint  Patient presents with   Follow-up    FU from CT  .  History of Present Illness: Patient returns today in follow up of his carotid disease. He has some residual left eye vision issues after his amaurosis fugax episode a couple weeks ago. No new complaints. He has undergone a CT angiogram which I have independently reviewed.  My interpretation of the CT scan would be a stenosis in the moderate range on the right side in the 50 to 60% range.  The left side appears to be a very high-grade stenosis of clearly greater than 80 to 85%.  The vessel has only mild tortuosity and moderate calcification.  The lesion does go up near the angle of the jaw on the left.  Current Outpatient Medications  Medication Sig Dispense Refill   aspirin EC 81 MG tablet Take 81 mg by mouth daily.     atorvastatin (LIPITOR) 80 MG tablet Take 80 mg by mouth daily.     carvedilol (COREG) 12.5 MG tablet Take 12.5 mg by mouth 2 (two) times daily with a meal.     EPINEPHrine 0.3 mg/0.3 mL IJ SOAJ injection      levocetirizine (XYZAL) 5 MG tablet Take 5 mg by mouth every evening.     losartan (COZAAR) 100 MG tablet Take 100 mg by mouth daily.     nitroGLYCERIN (NITROSTAT) 0.4 MG SL tablet Place under the tongue.     torsemide (DEMADEX) 20 MG tablet Take 20 mg by mouth daily.     No current facility-administered medications for this visit.    Past Medical History:  Diagnosis Date   Alcohol abuse    Aortic stenosis    Atherosclerotic PVD with intermittent claudication (HCC)    Chronic kidney disease, stage 3 (moderate)    Controlled type 2 diabetes mellitus with complication, without long-term current use of insulin (HCC)    Coronary artery disease involving native coronary artery of native heart with angina pectoris (HCC)    HTN (hypertension)    Hyperlipidemia    Ischemic cardiomyopathy    S/P  CABG (coronary artery bypass graft)     Past Surgical History:  Procedure Laterality Date   BACK SURGERY     COLONOSCOPY     CORONARY ARTERY BYPASS GRAFT     NASAL FRACTURE SURGERY     TONSILLECTOMY     WISDOM TOOTH EXTRACTION       Social History   Tobacco Use   Smoking status: Former    Types: Cigarettes    Quit date: 04/02/2010    Years since quitting: 10.7   Smokeless tobacco: Never  Substance Use Topics   Alcohol use: Yes    Alcohol/week: 13.0 standard drinks    Types: 13 Cans of beer per week   Drug use: Not Currently      Family History No bleeding disorders, clotting disorders, autoimmune diseases, or aneurysms  No Known Allergies   REVIEW OF SYSTEMS (Negative unless checked)  Constitutional: [] Weight loss  [] Fever  [] Chills Cardiac: [] Chest pain   [] Chest pressure   [] Palpitations   [] Shortness of breath when laying flat   [] Shortness of breath at rest   [x] Shortness of breath with exertion. Vascular:  [] Pain in legs with walking   [] Pain in legs at rest   [] Pain in legs when laying flat   [x] Claudication   []   Pain in feet when walking  [] Pain in feet at rest  [] Pain in feet when laying flat   [] History of DVT   [] Phlebitis   [] Swelling in legs   [] Varicose veins   [] Non-healing ulcers Pulmonary:   [] Uses home oxygen   [] Productive cough   [] Hemoptysis   [] Wheeze  [] COPD   [] Asthma Neurologic:  [] Dizziness  [] Blackouts   [] Seizures   [] History of stroke   [] History of TIA  [] Aphasia   [x] Temporary blindness   [] Dysphagia   [] Weakness or numbness in arms   [] Weakness or numbness in legs Musculoskeletal:  [] Arthritis   [] Joint swelling   [] Joint pain   [] Low back pain Hematologic:  [] Easy bruising  [] Easy bleeding   [] Hypercoagulable state   [] Anemic   Gastrointestinal:  [] Blood in stool   [] Vomiting blood  [] Gastroesophageal reflux/heartburn   [] Abdominal pain Genitourinary:  [x] Chronic kidney disease   [] Difficult urination  [] Frequent urination  [] Burning with  urination   [] Hematuria Skin:  [] Rashes   [] Ulcers   [] Wounds Psychological:  [] History of anxiety   []  History of major depression.  Physical Examination  BP 124/73    Pulse 60    Ht 6' (1.829 m)    Wt 234 lb (106.1 kg)    BMI 31.74 kg/m  Gen:  WD/WN, NAD Head: [place]/AT, No temporalis wasting. Ear/Nose/Throat: Hearing grossly intact, nares w/o erythema or drainage Eyes: Conjunctiva clear. Sclera non-icteric Neck: Supple.  Trachea midline Pulmonary:  Good air movement, no use of accessory muscles.  Cardiac: RRR, no JVD Vascular:  Vessel Right Left  Radial Palpable Palpable           Musculoskeletal: M/S 5/5 throughout.  No deformity or atrophy. No edema. Neurologic: Sensation grossly intact in extremities.  Symmetrical.  Speech is fluent.  Psychiatric: Judgment intact, Mood & affect appropriate for pt's clinical situation. Dermatologic: No rashes or ulcers noted.  No cellulitis or open wounds.      Labs No results found for this or any previous visit (from the past 2160 hour(s)).  Radiology VAS US CAROTID  Result Date: 01/05/2021 Carotid Arterial Duplex Study Patient Name:  Charles Ibarra  Date of Exam:   12/31/2020 Medical Rec #: [id number]         Accession #:    [id number] Date of Birth: [birth year]        Patient Gender: M Patient Age:   21 years Exam Location:  [facility] Procedure:      VAS US CAROTID Referring Phys: Keenan Bachelor [place] --------------------------------------------------------------------------------  Indications: Visual disturbance and left retinal artery blockage. Performing Technologist: Concha Norway RVT  Examination Guidelines: A complete evaluation includes B-mode imaging, spectral Doppler, color Doppler, and power Doppler as needed of all accessible portions of each vessel. Bilateral testing is considered an integral part of a complete examination. Limited examinations for reoccurring indications may be performed as noted.  Right Carotid Findings:  +----------+-------+-------+--------+---------------------------------+--------+             PSV     EDV     Stenosis Plaque Description                Comments              cm/s    cm/s                                                         +----------+-------+-------+--------+---------------------------------+--------+  CCA Prox   65      14                                                           +----------+-------+-------+--------+---------------------------------+--------+  CCA Mid    92      22                                                           +----------+-------+-------+--------+---------------------------------+--------+  CCA Distal 70      20                                                           +----------+-------+-------+--------+---------------------------------+--------+  ICA Prox   83      35      1-39%    irregular, calcific and                                                          heterogenous                                +----------+-------+-------+--------+---------------------------------+--------+  ICA Mid    85      23                                                           +----------+-------+-------+--------+---------------------------------+--------+  ICA Distal 82      32                                                           +----------+-------+-------+--------+---------------------------------+--------+  ECA        66      10                                                           +----------+-------+-------+--------+---------------------------------+--------+ +----------+--------+-------+----------------+-------------------+             PSV cm/s EDV cms Describe         Arm Pressure (mmHG)  +----------+--------+-------+----------------+-------------------+  Subclavian 74               Multiphasic, WNL                      +----------+--------+-------+----------------+-------------------+ +---------+--------+--+--------+--+---------+  Vertebral PSV cm/s 37 EDV  cm/s 13 Antegrade  +---------+--------+--+--------+--+---------+ ICA/CCA ratio = .90 Left Carotid Findings: +----------+--------+--------+--------+----------------------+--------+             PSV cm/s EDV cm/s Stenosis Plaque Description     Comments  +----------+--------+--------+--------+----------------------+--------+  CCA Prox   58       12                                                 +----------+--------+--------+--------+----------------------+--------+  CCA Mid    52       11                                                 +----------+--------+--------+--------+----------------------+--------+  CCA Distal 51       8        >50%     heterogenous                     +----------+--------+--------+--------+----------------------+--------+  ICA Prox   329      177      80-99%   calcific and irregular           +----------+--------+--------+--------+----------------------+--------+  ICA Mid    352      113      80-99%                                    +----------+--------+--------+--------+----------------------+--------+  ICA Distal 60       10                                                 +----------+--------+--------+--------+----------------------+--------+  ECA        60                                                          +----------+--------+--------+--------+----------------------+--------+ +----------+--------+--------+----------------+-------------------+             PSV cm/s EDV cm/s Describe         Arm Pressure (mmHG)  +----------+--------+--------+----------------+-------------------+  Subclavian 92                Multiphasic, WNL                      +----------+--------+--------+----------------+-------------------+ +---------+--------+--+--------+--+---------+  Vertebral PSV cm/s 79 EDV cm/s 25 Antegrade  +---------+--------+--+--------+--+---------+   Summary: Right Carotid: Velocities in the right ICA are consistent with a 1-39% stenosis.                Moderate calcified and irregular  plaque in the bulb/ICA. Left Carotid: Velocities in the left ICA are consistent with a 80-99% stenosis.               Hemodynamically significant plaque >50% visualized in the CCA.  Severe amount of calcified irregular plaque in the ICA with               significant narrowing seen. Vertebrals:  Bilateral vertebral arteries demonstrate antegrade flow. Subclavians: Normal flow hemodynamics were seen in bilateral subclavian              arteries. *See table(s) above for measurements and observations.  Electronically signed by Leotis Pain MD on 01/05/2021 at 7:33:30 AM.    Final     Assessment/Plan  Carotid stenosis, left He has undergone a CT angiogram which I have independently reviewed.  My interpretation of the CT scan would be a stenosis in the moderate range on the right side in the 50 to 60% range.  The left side appears to be a very high-grade stenosis of clearly greater than 80 to 85%.  The vessel has only mild tortuosity and moderate calcification.  The lesion does go up near the angle of the jaw on the left. We had a long discussion today regarding the options for treatment.  At this point, he clearly needs to have intervention or surgery to correct the stenosis and reduce his risk of stroke.  I have discussed the differences between carotid endarterectomy and carotid stenting.  I discussed the pros and the cons of each method.  He is adamantly opposed open surgical therapy and would like to have a carotid stent.  His anatomy is not ideal for carotid stenting, but it appears to be acceptable for carotid stenting.  Plavix will be initiated at this time.  This will be scheduled for the near future at his convenience.  Essential hypertension blood pressure control important in reducing the progression of atherosclerotic disease. On appropriate oral medications.   Retinal arterial branch occlusion, left Represents symptoms from his severe carotid disease on the left  Renal artery  stenosis [facility]) Previously has been checked.  Can reassess this after his carotid is treated definitively.  Hyperlipidemia lipid control important in reducing the progression of atherosclerotic disease. Continue statin therapy   Chronic kidney disease (CKD), stage III (moderate) Hydrate and limit contrast with the procedure.    Leotis Pain, MD  01/11/2021 12:03 PM    This note was created with Dragon medical transcription system.  Any errors from dictation are purely unintentional

## 2021-01-21 ENCOUNTER — Encounter (INDEPENDENT_AMBULATORY_CARE_PROVIDER_SITE_OTHER): Payer: Self-pay

## 2021-01-25 ENCOUNTER — Telehealth (INDEPENDENT_AMBULATORY_CARE_PROVIDER_SITE_OTHER): Payer: Self-pay

## 2021-01-25 DIAGNOSIS — Z8739 Personal history of other diseases of the musculoskeletal system and connective tissue: Secondary | ICD-10-CM | POA: Diagnosis not present

## 2021-01-25 DIAGNOSIS — M7989 Other specified soft tissue disorders: Secondary | ICD-10-CM | POA: Diagnosis not present

## 2021-01-25 DIAGNOSIS — M79672 Pain in left foot: Secondary | ICD-10-CM | POA: Diagnosis not present

## 2021-01-25 NOTE — Telephone Encounter (Signed)
Patient came by and gave [place] his insurance information, I was unable to scan in card but the info for contacting the insurance is - [employer]: [place]: [phone]

## 2021-01-27 ENCOUNTER — Other Ambulatory Visit (INDEPENDENT_AMBULATORY_CARE_PROVIDER_SITE_OTHER): Payer: Self-pay | Admitting: Nurse Practitioner

## 2021-01-31 ENCOUNTER — Telehealth (INDEPENDENT_AMBULATORY_CARE_PROVIDER_SITE_OTHER): Payer: Self-pay

## 2021-01-31 NOTE — Telephone Encounter (Signed)
Spoke with the patient and he is scheduled with Dr. Lucky Cowboy for a left carotid stent placement on 02/10/21 with a 6:45 am arrival time to the MM. Pre-procedure instructions were discussed and will be mailed. Covid testing on 02/08/21 between 8-12 pm at the [facility].

## 2021-02-08 ENCOUNTER — Other Ambulatory Visit: Payer: Self-pay

## 2021-02-08 ENCOUNTER — Other Ambulatory Visit
Admission: RE | Admit: 2021-02-08 | Discharge: 2021-02-08 | Disposition: A | Discharge status: Home / Self Care | Payer: 59 | Source: Ambulatory Visit | Attending: Vascular Surgery | Admitting: Vascular Surgery

## 2021-02-08 DIAGNOSIS — Z01812 Encounter for preprocedural laboratory examination: Secondary | ICD-10-CM | POA: Insufficient documentation

## 2021-02-08 DIAGNOSIS — Z20822 Contact with and (suspected) exposure to covid-19: Secondary | ICD-10-CM | POA: Insufficient documentation

## 2021-02-08 LAB — SARS CORONAVIRUS 2 (TAT 6-24 HRS): SARS Coronavirus 2: NEGATIVE

## 2021-02-09 ENCOUNTER — Other Ambulatory Visit (INDEPENDENT_AMBULATORY_CARE_PROVIDER_SITE_OTHER): Payer: Self-pay | Admitting: Nurse Practitioner

## 2021-02-09 DIAGNOSIS — I6522 Occlusion and stenosis of left carotid artery: Secondary | ICD-10-CM

## 2021-02-10 ENCOUNTER — Encounter: Payer: Self-pay | Admitting: Vascular Surgery

## 2021-02-10 ENCOUNTER — Encounter
Admission: RE | Disposition: A | Discharge status: Home / Self Care | Payer: Self-pay | Source: Ambulatory Visit | Attending: Vascular Surgery

## 2021-02-10 ENCOUNTER — Other Ambulatory Visit: Payer: Self-pay

## 2021-02-10 ENCOUNTER — Inpatient Hospital Stay
Admission: RE | Admit: 2021-02-10 | Discharge: 2021-02-11 | DRG: 036 | Disposition: A | Discharge status: Home / Self Care | Payer: 59 | Attending: Vascular Surgery | Admitting: Vascular Surgery

## 2021-02-10 DIAGNOSIS — Z7982 Long term (current) use of aspirin: Secondary | ICD-10-CM | POA: Diagnosis not present

## 2021-02-10 DIAGNOSIS — Z01812 Encounter for preprocedural laboratory examination: Secondary | ICD-10-CM | POA: Diagnosis not present

## 2021-02-10 DIAGNOSIS — I6522 Occlusion and stenosis of left carotid artery: Principal | ICD-10-CM | POA: Diagnosis present

## 2021-02-10 DIAGNOSIS — Z006 Encounter for examination for normal comparison and control in clinical research program: Secondary | ICD-10-CM

## 2021-02-10 DIAGNOSIS — H539 Unspecified visual disturbance: Secondary | ICD-10-CM | POA: Diagnosis present

## 2021-02-10 DIAGNOSIS — Z79899 Other long term (current) drug therapy: Secondary | ICD-10-CM

## 2021-02-10 DIAGNOSIS — I25119 Atherosclerotic heart disease of native coronary artery with unspecified angina pectoris: Secondary | ICD-10-CM | POA: Diagnosis present

## 2021-02-10 DIAGNOSIS — N183 Chronic kidney disease, stage 3 unspecified: Secondary | ICD-10-CM | POA: Diagnosis present

## 2021-02-10 DIAGNOSIS — E1122 Type 2 diabetes mellitus with diabetic chronic kidney disease: Secondary | ICD-10-CM | POA: Diagnosis present

## 2021-02-10 DIAGNOSIS — Z20822 Contact with and (suspected) exposure to covid-19: Secondary | ICD-10-CM | POA: Diagnosis present

## 2021-02-10 DIAGNOSIS — Z87891 Personal history of nicotine dependence: Secondary | ICD-10-CM

## 2021-02-10 DIAGNOSIS — Z951 Presence of aortocoronary bypass graft: Secondary | ICD-10-CM | POA: Diagnosis not present

## 2021-02-10 DIAGNOSIS — I255 Ischemic cardiomyopathy: Secondary | ICD-10-CM | POA: Diagnosis not present

## 2021-02-10 DIAGNOSIS — I129 Hypertensive chronic kidney disease with stage 1 through stage 4 chronic kidney disease, or unspecified chronic kidney disease: Secondary | ICD-10-CM | POA: Diagnosis not present

## 2021-02-10 DIAGNOSIS — E785 Hyperlipidemia, unspecified: Secondary | ICD-10-CM | POA: Diagnosis not present

## 2021-02-10 HISTORY — PX: CAROTID PTA/STENT INTERVENTION: CATH118231

## 2021-02-10 LAB — CREATININE, SERUM
Creatinine, Ser: 1.33 mg/dL — ABNORMAL HIGH (ref 0.61–1.24)
GFR, Estimated: >60 mL/min (ref >60)

## 2021-02-10 LAB — MRSA NEXT GEN BY PCR, NASAL: MRSA by PCR Next Gen: NOT DETECTED

## 2021-02-10 LAB — GLUCOSE, CAPILLARY: Glucose-Capillary: 130 mg/dL — ABNORMAL HIGH (ref 70–99)

## 2021-02-10 LAB — BUN: BUN: 19 mg/dL (ref 6–20)

## 2021-02-10 SURGERY — CAROTID PTA/STENT INTERVENTION
Anesthesia: Moderate Sedation | Laterality: Left

## 2021-02-10 MED ORDER — ATROPINE SULFATE 1 MG/10ML IJ SOSY
PREFILLED_SYRINGE | INTRAMUSCULAR | Status: DC | PRN
  Administered 2021-02-10: 1 mg via INTRAVENOUS

## 2021-02-10 MED ORDER — ALUM & MAG HYDROXIDE-SIMETH 200-200-20 MG/5ML PO SUSP: 15.0000 mL | ORAL | Status: DC | PRN

## 2021-02-10 MED ORDER — ATORVASTATIN CALCIUM 20 MG PO TABS
80.0000 mg | ORAL_TABLET | Freq: Every day | ORAL | Status: DC
  Administered 2021-02-10: 80 mg via ORAL
  Filled 2021-02-10: qty 4

## 2021-02-10 MED ORDER — HYDROMORPHONE HCL 1 MG/ML IJ SOLN: 1.0000 mg | Freq: Once | INTRAMUSCULAR | Status: DC | PRN

## 2021-02-10 MED ORDER — LOSARTAN POTASSIUM 50 MG PO TABS
100.0000 mg | ORAL_TABLET | Freq: Every day | ORAL | Status: DC
  Filled 2021-02-10: qty 2

## 2021-02-10 MED ORDER — SODIUM CHLORIDE 0.9 % IV SOLN: INTRAVENOUS | Status: DC

## 2021-02-10 MED ORDER — FENTANYL CITRATE PF 50 MCG/ML IJ SOSY
PREFILLED_SYRINGE | INTRAMUSCULAR | Status: AC
  Filled 2021-02-10: qty 2

## 2021-02-10 MED ORDER — POTASSIUM CHLORIDE CRYS ER 20 MEQ PO TBCR: 20.0000 meq | EXTENDED_RELEASE_TABLET | Freq: Every day | ORAL | Status: DC | PRN

## 2021-02-10 MED ORDER — MORPHINE SULFATE (PF) 4 MG/ML IV SOLN: 2.0000 mg | INTRAVENOUS | Status: DC | PRN

## 2021-02-10 MED ORDER — CEFAZOLIN SODIUM-DEXTROSE 2-4 GM/100ML-% IV SOLN: 2.0000 g | Freq: Once | INTRAVENOUS | Status: AC

## 2021-02-10 MED ORDER — HEPARIN SODIUM (PORCINE) 1000 UNIT/ML IJ SOLN
INTRAMUSCULAR | Status: AC
  Filled 2021-02-10: qty 10

## 2021-02-10 MED ORDER — GUAIFENESIN-DM 100-10 MG/5ML PO SYRP
15.0000 mL | ORAL_SOLUTION | ORAL | Status: DC | PRN
  Filled 2021-02-10: qty 15

## 2021-02-10 MED ORDER — FENTANYL CITRATE (PF) 100 MCG/2ML IJ SOLN
INTRAMUSCULAR | Status: DC | PRN
  Administered 2021-02-10: 50 ug via INTRAVENOUS
  Administered 2021-02-10: 25 ug via INTRAVENOUS

## 2021-02-10 MED ORDER — ONDANSETRON HCL 4 MG/2ML IJ SOLN: 4.0000 mg | Freq: Four times a day (QID) | INTRAMUSCULAR | Status: DC | PRN

## 2021-02-10 MED ORDER — ACETAMINOPHEN 325 MG RE SUPP
325.0000 mg | RECTAL | Status: DC | PRN
  Filled 2021-02-10: qty 2

## 2021-02-10 MED ORDER — MIDAZOLAM HCL 2 MG/ML PO SYRP: 8.0000 mg | ORAL_SOLUTION | Freq: Once | ORAL | Status: DC | PRN

## 2021-02-10 MED ORDER — FAMOTIDINE 20 MG PO TABS: 40.0000 mg | ORAL_TABLET | Freq: Once | ORAL | Status: DC | PRN

## 2021-02-10 MED ORDER — LABETALOL HCL 5 MG/ML IV SOLN: 10.0000 mg | INTRAVENOUS | Status: DC | PRN

## 2021-02-10 MED ORDER — CARVEDILOL 12.5 MG PO TABS
12.5000 mg | ORAL_TABLET | Freq: Two times a day (BID) | ORAL | Status: DC
  Filled 2021-02-10: qty 1

## 2021-02-10 MED ORDER — SODIUM CHLORIDE 0.9 % IV SOLN: 500.0000 mL | Freq: Once | INTRAVENOUS | Status: DC | PRN

## 2021-02-10 MED ORDER — MIDAZOLAM HCL 5 MG/5ML IJ SOLN
INTRAMUSCULAR | Status: AC
  Filled 2021-02-10: qty 5

## 2021-02-10 MED ORDER — CHLORHEXIDINE GLUCONATE CLOTH 2 % EX PADS
6.0000 | MEDICATED_PAD | Freq: Every day | CUTANEOUS | Status: DC
  Administered 2021-02-10 – 2021-02-11 (×2): 6 via TOPICAL

## 2021-02-10 MED ORDER — ACETAMINOPHEN 325 MG PO TABS: 325.0000 mg | ORAL_TABLET | ORAL | Status: DC | PRN

## 2021-02-10 MED ORDER — HYDRALAZINE HCL 20 MG/ML IJ SOLN: 5.0000 mg | INTRAMUSCULAR | Status: DC | PRN

## 2021-02-10 MED ORDER — CEFAZOLIN SODIUM-DEXTROSE 2-4 GM/100ML-% IV SOLN
2.0000 g | Freq: Three times a day (TID) | INTRAVENOUS | Status: AC
  Administered 2021-02-10 – 2021-02-11 (×2): 2 g via INTRAVENOUS
  Filled 2021-02-10 (×3): qty 100

## 2021-02-10 MED ORDER — OXYCODONE-ACETAMINOPHEN 5-325 MG PO TABS: 1.0000 | ORAL_TABLET | ORAL | Status: DC | PRN

## 2021-02-10 MED ORDER — HEPARIN SODIUM (PORCINE) 1000 UNIT/ML IJ SOLN
INTRAMUSCULAR | Status: DC | PRN
  Administered 2021-02-10: 2000 [IU] via INTRAVENOUS
  Administered 2021-02-10: 7000 [IU] via INTRAVENOUS

## 2021-02-10 MED ORDER — CEFAZOLIN SODIUM-DEXTROSE 2-4 GM/100ML-% IV SOLN
INTRAVENOUS | Status: AC
  Administered 2021-02-10: 2 g via INTRAVENOUS
  Filled 2021-02-10: qty 100

## 2021-02-10 MED ORDER — TORSEMIDE 20 MG PO TABS
20.0000 mg | ORAL_TABLET | Freq: Every day | ORAL | Status: DC
  Administered 2021-02-10: 20 mg via ORAL
  Filled 2021-02-10 (×2): qty 1

## 2021-02-10 MED ORDER — PHENYLEPHRINE 40 MCG/ML (10ML) SYRINGE FOR IV PUSH (FOR BLOOD PRESSURE SUPPORT)
PREFILLED_SYRINGE | INTRAVENOUS | Status: AC
  Filled 2021-02-10: qty 10

## 2021-02-10 MED ORDER — METOPROLOL TARTRATE 5 MG/5ML IV SOLN: 2.0000 mg | INTRAVENOUS | Status: DC | PRN

## 2021-02-10 MED ORDER — MAGNESIUM SULFATE 2 GM/50ML IV SOLN
2.0000 g | Freq: Every day | INTRAVENOUS | Status: DC | PRN
  Filled 2021-02-10: qty 50

## 2021-02-10 MED ORDER — MIDAZOLAM HCL 2 MG/2ML IJ SOLN
INTRAMUSCULAR | Status: DC | PRN
  Administered 2021-02-10: 1 mg via INTRAVENOUS
  Administered 2021-02-10: 2 mg via INTRAVENOUS

## 2021-02-10 MED ORDER — METHYLPREDNISOLONE SODIUM SUCC 125 MG IJ SOLR: 125.0000 mg | Freq: Once | INTRAMUSCULAR | Status: DC | PRN

## 2021-02-10 MED ORDER — FAMOTIDINE IN NACL 20-0.9 MG/50ML-% IV SOLN
20.0000 mg | Freq: Two times a day (BID) | INTRAVENOUS | Status: DC
  Administered 2021-02-10 (×2): 20 mg via INTRAVENOUS
  Filled 2021-02-10 (×2): qty 50

## 2021-02-10 MED ORDER — CLOPIDOGREL BISULFATE 75 MG PO TABS
75.0000 mg | ORAL_TABLET | Freq: Every day | ORAL | Status: DC
  Filled 2021-02-10: qty 1

## 2021-02-10 MED ORDER — NITROGLYCERIN 0.4 MG SL SUBL: 0.4000 mg | SUBLINGUAL_TABLET | SUBLINGUAL | Status: DC | PRN

## 2021-02-10 MED ORDER — PHENYLEPHRINE HCL-NACL 20-0.9 MG/250ML-% IV SOLN
INTRAVENOUS | Status: AC
  Filled 2021-02-10: qty 250

## 2021-02-10 MED ORDER — ASPIRIN EC 81 MG PO TBEC
81.0000 mg | DELAYED_RELEASE_TABLET | Freq: Every day | ORAL | Status: DC
  Filled 2021-02-10: qty 1

## 2021-02-10 MED ORDER — DIPHENHYDRAMINE HCL 50 MG/ML IJ SOLN: 50.0000 mg | Freq: Once | INTRAMUSCULAR | Status: DC | PRN

## 2021-02-10 MED ORDER — PHENOL 1.4 % MT LIQD
1.0000 | OROMUCOSAL | Status: DC | PRN
  Filled 2021-02-10: qty 177

## 2021-02-10 SURGICAL SUPPLY — 18 items
BALLN VIATRAC 5X30X135 (BALLOONS) ×2
BALLOON VIATRAC 5X30X135 (BALLOONS) IMPLANT
CATH ANGIO 5F PIGTAIL 100CM (CATHETERS) ×1 IMPLANT
CATH BEACON 5 .035 100 H1 TIP (CATHETERS) ×1 IMPLANT
COVER DRAPE FLUORO 36X44 (DRAPES) ×1 IMPLANT
COVER PROBE U/S 5X48 (MISCELLANEOUS) ×1 IMPLANT
DEVICE EMBOSHIELD NAV6 4.0-7.0 (FILTER) ×1 IMPLANT
DEVICE STARCLOSE SE CLOSURE (Vascular Products) ×1 IMPLANT
DEVICE TORQUE (MISCELLANEOUS) ×1 IMPLANT
GLIDEWIRE ANGLED SS 035X260CM (WIRE) ×1 IMPLANT
GUIDEWIRE VASC STIFF .038X260 (WIRE) ×1 IMPLANT
KIT CAROTID MANIFOLD (MISCELLANEOUS) ×1 IMPLANT
KIT ENCORE 26 ADVANTAGE (KITS) ×1 IMPLANT
PACK ANGIOGRAPHY (CUSTOM PROCEDURE TRAY) ×2 IMPLANT
SHEATH BRITE TIP 5FRX11 (SHEATH) ×1 IMPLANT
SHEATH SHUTTLE SELECT 6F (SHEATH) ×1 IMPLANT
STENT XACT CAR 9-7X40X136 (Permanent Stent) ×1 IMPLANT
WIRE GUIDERIGHT .035X150 (WIRE) ×1 IMPLANT

## 2021-02-10 NOTE — Interval H&P Note (Signed)
History and Physical Interval Note:  02/10/2021 8:14 AM  Charles Ibarra  has presented today for surgery, with the diagnosis of LT Carotid Stent Placement   LT Carotid Artery Stenosis ABBOTT Rep.  The various methods of treatment have been discussed with the patient and family. After consideration of risks, benefits and other options for treatment, the patient has consented to  Procedure(s): CAROTID PTA/STENT INTERVENTION (Left) as a surgical intervention.  [name] history has been reviewed, patient examined, no change in status, stable for surgery.  I have reviewed the patient's chart and labs.  Questions were answered to the patient's satisfaction.     Leotis Pain

## 2021-02-10 NOTE — Op Note (Signed)
OPERATIVE NOTE DATE: 02/10/2021  PROCEDURE:  Ultrasound guidance for vascular access right femoral artery  Placement of a 9 mm proximal, 7 mm distal, 4 cm long exact stent with the use of the NAV-6 embolic protection device in the left carotid artery  PRE-OPERATIVE DIAGNOSIS: 1.  High-grade left carotid artery stenosis. 2.  Left sided amaurosis fugax and visual symptoms  POST-OPERATIVE DIAGNOSIS:  Same as above  SURGEON: Leotis Pain, MD  ASSISTANT(S): None  ANESTHESIA: local/MCS  ESTIMATED BLOOD LOSS: 20 cc  CONTRAST: 70 cc  FLUORO TIME: 5.7 minutes  MODERATE CONSCIOUS SEDATION TIME:  Approximately 50 minutes using 3 mg of Versed and 75 mcg of Fentanyl  FINDING(S): 1.   90 to 95% left carotid artery stenosis  SPECIMEN(S):   none  INDICATIONS:   Patient is a 56 y.o. male who presents with left carotid artery stenosis.  He has had previous amaurosis fugax and visual symptoms.  The patient has a high lesion that will be difficult to get to with surgery and carotid artery stenting was felt to be preferred to endarterectomy for that reason.  Risks and benefits were discussed and informed consent was obtained.   DESCRIPTION: After obtaining full informed written consent, the patient was brought back to the vascular suite and placed supine upon the table.  The patient received IV antibiotics prior to induction. Moderate conscious sedation was administered during a face to face encounter with the patient throughout the procedure with my supervision of the RN administering medicines and monitoring the patients vital signs and mental status throughout from the start of the procedure until the patient was taken to the recovery room.  After obtaining adequate anesthesia, the patient was prepped and draped in the standard fashion.   The right femoral artery was visualized with ultrasound and found to be widely patent. It was then accessed under direct ultrasound guidance without difficulty  with a Seldinger needle. A permanent image was recorded. A J-wire was placed and we then placed a 6 French sheath. The patient was then heparinized and a total of 9000 units of intravenous heparin were given and an ACT was checked to confirm successful anticoagulation. A pigtail catheter was then placed into the ascending aorta. This showed bovine configuration with no proximal stenosis of the great vessels. I then selectively cannulated the left common carotid artery without difficulty with a headhunter catheter and advanced into the mid left common carotid artery.  Cervical and cerebral carotid angiography was then performed. There were no obvious intracranial filling defects with no significant anterior cerebral flow and sluggish middle cerebral flow. The carotid bifurcation demonstrated a highly calcific 90 to 95% left ICA stenosis.  I then advanced into the external carotid artery with a Glidewire and the headhunter catheter and then exchanged for the Amplatz Super Stiff wire. Over the Amplatz Super Stiff wire, a 6 [place] shuttle sheath was placed into the mid common carotid artery. I then used the NAV-6  Embolic protection device and crossed the lesion and parked this in the distal internal carotid artery at the base of the skull.  I then selected a 9 mm proximal, 7 mm distal, 4 cm long exact stent. This was deployed across the lesion encompassing it in its entirety. A 5 mm diameter by 3 cm length balloon was used to post dilate the stent. Only about a 15-20% residual stenosis was present after angioplasty. Completion angiogram showed normal intracranial filling without new defects. At this point I elected to terminate  the procedure. The sheath was removed and StarClose closure device was deployed in the right femoral artery with excellent hemostatic result. The patient was taken to the recovery room in stable condition having tolerated the procedure well.  COMPLICATIONS: none  CONDITION: stable  Leotis Pain 02/10/2021 10:31 AM   This note was created with Dragon Medical transcription system. Any errors in dictation are purely unintentional.

## 2021-02-10 NOTE — Plan of Care (Signed)
Patient arrived from [facility] alert and oriented VSS.  PAD is loose on patient okay to get up.  No distress noted.

## 2021-02-10 NOTE — Progress Notes (Signed)
Upon arrival PAD is loose and not covering insertion site on R Fem.  Dr. Lucky Cowboy okay with removing PAD and let\ting patient get up this evening.

## 2021-02-11 ENCOUNTER — Encounter: Payer: Self-pay | Admitting: Vascular Surgery

## 2021-02-11 LAB — CBC
HCT: 31.7 % — ABNORMAL LOW (ref 39.0–52.0)
Hemoglobin: 10.6 g/dL — ABNORMAL LOW (ref 13.0–17.0)
MCH: 32 pg (ref 26.0–34.0)
MCHC: 33.4 g/dL (ref 30.0–36.0)
MCV: 95.8 fL (ref 80.0–100.0)
Platelets: 118 10*3/uL — ABNORMAL LOW (ref 150–400)
RBC: 3.31 MIL/uL — ABNORMAL LOW (ref 4.22–5.81)
RDW: 13.2 % (ref 11.5–15.5)
WBC: 5.7 10*3/uL (ref 4.0–10.5)
nRBC: 0 % (ref 0.0–0.2)

## 2021-02-11 LAB — BASIC METABOLIC PANEL
Anion gap: 7 (ref 5–15)
BUN: 17 mg/dL (ref 6–20)
CO2: 26 mmol/L (ref 22–32)
Calcium: 9.1 mg/dL (ref 8.9–10.3)
Chloride: 103 mmol/L (ref 98–111)
Creatinine, Ser: 1.38 mg/dL — ABNORMAL HIGH (ref 0.61–1.24)
GFR, Estimated: 60 mL/min — ABNORMAL LOW (ref >60)
Glucose, Bld: 135 mg/dL — ABNORMAL HIGH (ref 70–99)
Potassium: 3.7 mmol/L (ref 3.5–5.1)
Sodium: 136 mmol/L (ref 135–145)

## 2021-02-11 LAB — POCT ACTIVATED CLOTTING TIME: Activated Clotting Time: 257 seconds

## 2021-02-11 NOTE — Progress Notes (Signed)
Pt for discharge today. Tolerated ambulation in the hallway well. R groin sign asymptomatic. DC instructions and AVS given to pt. Pt prefers to take all meds form his home supply when he gets home, refused AM meds. Discharged at [phone].

## 2021-02-11 NOTE — Discharge Summary (Signed)
[facility]    Discharge Summary    Patient ID:  Charles Ibarra MRN: [id number] DOB/AGE: 58-Jun-1967 58 y.o.  Admit date: 02/10/2021 Discharge date: 02/11/2021 Date of Surgery: 02/10/2021 Surgeon: Surgeon(s): Darely Becknell, Erskine Squibb, MD  Admission Diagnosis: Carotid stenosis, left [I65.22]  Discharge Diagnoses:  Carotid stenosis, left [I65.22]  Secondary Diagnoses: Past Medical History:  Diagnosis Date   Alcohol abuse    Aortic stenosis    Atherosclerotic PVD with intermittent claudication (HCC)    Chronic kidney disease, stage 3 (moderate)    Coronary artery disease involving native coronary artery of native heart with angina pectoris (HCC)    HTN (hypertension)    Hyperlipidemia    Ischemic cardiomyopathy    S/P CABG (coronary artery bypass graft)     Procedure(s): CAROTID PTA/STENT INTERVENTION  Discharged Condition: good  HPI:  Patient with previous amaurosis and visual symptoms with a high grade left carotid stenosis.  Here for left carotid stent  Hospital Course:  Charles Ibarra is a 56 y.o. male is S/P Left Procedure(s): CAROTID PTA/STENT INTERVENTION Extubated: NA Physical exam: neuro exam normal, VSS Post-op wounds clean, dry, intact or healing well Pt. Ambulating, voiding and taking PO diet without difficulty. Pt pain controlled with PO pain meds. Labs as below Complications:none  Consults:    Significant Diagnostic Studies: CBC Lab Results  Component Value Date   WBC 5.7 02/11/2021   HGB 10.6 (L) 02/11/2021   HCT 31.7 (L) 02/11/2021   MCV 95.8 02/11/2021   PLT 118 (L) 02/11/2021    BMET    Component Value Date/Time   NA 136 02/11/2021 0522   K 3.7 02/11/2021 0522   CL 103 02/11/2021 0522   CO2 26 02/11/2021 0522   GLUCOSE 135 (H) 02/11/2021 0522   BUN 17 02/11/2021 0522   CREATININE 1.38 (H) 02/11/2021 0522   CALCIUM 9.1 02/11/2021 0522   GFRNONAA 60 (L) 02/11/2021 0522   COAG No results found for: INR,  PROTIME   Disposition:  Discharge to :Home Discharge Instructions     Call MD for:  redness, tenderness, or signs of infection (pain, swelling, bleeding, redness, odor or green/yellow discharge around incision site)   Complete by: As directed    Call MD for:  severe or increased pain, loss or decreased feeling  in affected limb(s)   Complete by: As directed    Call MD for:  temperature >100.5   Complete by: As directed    Driving Restrictions   Complete by: As directed    No driving for 24 hours   Lifting restrictions   Complete by: As directed    No lifting for 48 hours   No dressing needed   Complete by: As directed    Replace only if drainage present   Resume previous diet   Complete by: As directed       Allergies as of 02/11/2021   No Known Allergies      Medication List     TAKE these medications    aspirin EC 81 MG tablet Take 81 mg by mouth daily.   atorvastatin 80 MG tablet Commonly known as: LIPITOR Take 80 mg by mouth at bedtime.   carvedilol 12.5 MG tablet Commonly known as: COREG Take 12.5 mg by mouth 2 (two) times daily with a meal.   clopidogrel 75 MG tablet Commonly known as: PLAVIX Take 1 tablet (75 mg total) by mouth daily.   EPINEPHrine 0.3 mg/0.3 mL Soaj injection Commonly  known as: EPI-PEN   levocetirizine 5 MG tablet Commonly known as: XYZAL Take 5 mg by mouth every evening.   losartan 100 MG tablet Commonly known as: COZAAR Take 100 mg by mouth daily.   nitroGLYCERIN 0.4 MG SL tablet Commonly known as: NITROSTAT Place under the tongue.   torsemide 20 MG tablet Commonly known as: DEMADEX Take 20 mg by mouth daily.       Verbal and written Discharge instructions given to the patient. Wound care per Discharge AVS  Follow-up Information     Kris Hartmann, NP Follow up in 4 week(s).   Specialty: Vascular Surgery Why: with carotid duplex Contact information: [place]  [phone]                  Signed: Leotis Pain, MD  02/11/2021, 7:58 AM

## 2021-03-16 ENCOUNTER — Other Ambulatory Visit (INDEPENDENT_AMBULATORY_CARE_PROVIDER_SITE_OTHER): Payer: Self-pay | Admitting: Vascular Surgery

## 2021-03-16 ENCOUNTER — Ambulatory Visit (INDEPENDENT_AMBULATORY_CARE_PROVIDER_SITE_OTHER): Payer: 59 | Admitting: Nurse Practitioner

## 2021-03-16 ENCOUNTER — Encounter (INDEPENDENT_AMBULATORY_CARE_PROVIDER_SITE_OTHER): Payer: Self-pay | Admitting: Nurse Practitioner

## 2021-03-16 ENCOUNTER — Ambulatory Visit (INDEPENDENT_AMBULATORY_CARE_PROVIDER_SITE_OTHER): Payer: 59

## 2021-03-16 ENCOUNTER — Other Ambulatory Visit: Payer: Self-pay

## 2021-03-16 VITALS — BP 115/69 | HR 69 | Resp 16 | Wt 226.0 lb

## 2021-03-16 DIAGNOSIS — N183 Chronic kidney disease, stage 3 unspecified: Secondary | ICD-10-CM

## 2021-03-16 DIAGNOSIS — I6523 Occlusion and stenosis of bilateral carotid arteries: Secondary | ICD-10-CM

## 2021-03-16 DIAGNOSIS — I10 Essential (primary) hypertension: Secondary | ICD-10-CM

## 2021-03-16 DIAGNOSIS — Z959 Presence of cardiac and vascular implant and graft, unspecified: Secondary | ICD-10-CM

## 2021-03-16 DIAGNOSIS — I739 Peripheral vascular disease, unspecified: Secondary | ICD-10-CM

## 2021-03-16 DIAGNOSIS — E1122 Type 2 diabetes mellitus with diabetic chronic kidney disease: Secondary | ICD-10-CM

## 2021-03-27 ENCOUNTER — Encounter (INDEPENDENT_AMBULATORY_CARE_PROVIDER_SITE_OTHER): Payer: Self-pay | Admitting: Nurse Practitioner

## 2021-03-27 NOTE — Progress Notes (Signed)
Subjective:    Patient ID: Charles Ibarra, male    DOB: 1965/04/13, 56 y.o.   MRN: [id number] Chief Complaint  Patient presents with   Routine Post Op    [facility] 4wk carotid stent    The patient is seen for follow up evaluation of carotid stenosis status post left carotid stent placement on 02/10/2021.  There were no post operative problems or complications related to the surgery.  The patient denies neck or incisional pain.  The patient denies interval amaurosis fugax. There is no recent history of TIA symptoms or focal motor deficits. There is no prior documented CVA.  The patient denies headache.  The patient is taking enteric-coated aspirin 81 mg daily.  The patient has a history of coronary artery disease, no recent episodes of angina or shortness of breath. The patient has recently began to have some claudication-like symptoms.  He does have a history of lower back issues with sciatica. There is a history of hyperlipidemia which is being treated with a statin.    Today the right ICA shows 1 to 39% stenosis with a widely patent left ICA post stent placement.   Review of Systems  All other systems reviewed and are negative.     Objective:   Physical Exam Vitals reviewed.  HENT:     Head: Normocephalic.  Cardiovascular:     Rate and Rhythm: Normal rate.     Pulses:          Dorsalis pedis pulses are 1+ on the right side and 1+ on the left side.       Posterior tibial pulses are 1+ on the right side and 1+ on the left side.  Pulmonary:     Effort: Pulmonary effort is normal.  Skin:    General: Skin is warm and dry.  Neurological:     Mental Status: He is alert and oriented to person, place, and time.  Psychiatric:        Mood and Affect: Mood normal.        Behavior: Behavior normal.        Thought Content: Thought content normal.        Judgment: Judgment normal.    BP 115/69 (BP Location: Left Arm)    Pulse 69    Resp 16    Wt 226 lb (102.5 kg)    BMI 30.65  kg/m   Past Medical History:  Diagnosis Date   Alcohol abuse    Aortic stenosis    Atherosclerotic PVD with intermittent claudication (HCC)    Chronic kidney disease, stage 3 (moderate)    Coronary artery disease involving native coronary artery of native heart with angina pectoris (HCC)    HTN (hypertension)    Hyperlipidemia    Ischemic cardiomyopathy    S/P CABG (coronary artery bypass graft)     Social History   Socioeconomic History   Marital status: Divorced    Spouse name: Not on file   Number of children: Not on file   Years of education: Not on file   Highest education level: Not on file  Occupational History   Not on file  Tobacco Use   Smoking status: Former    Types: Cigarettes    Quit date: 04/02/2010    Years since quitting: 10.9   Smokeless tobacco: Never  Substance and Sexual Activity   Alcohol use: Yes    Alcohol/week: 13.0 standard drinks    Types: 13 Cans of beer per week  Drug use: Not Currently   Sexual activity: Not Currently  Other Topics Concern   Not on file  Social History Narrative   Not on file   Social Determinants of Health   Financial Resource Strain: Not on file  Food Insecurity: Not on file  Transportation Needs: Not on file  Physical Activity: Not on file  Stress: Not on file  Social Connections: Not on file  Intimate Partner Violence: Not on file    Past Surgical History:  Procedure Laterality Date   BACK SURGERY     CAROTID PTA/STENT INTERVENTION Left 02/10/2021   Procedure: CAROTID PTA/STENT INTERVENTION;  Surgeon: Algernon Huxley, MD;  Location: [facility];  Service: Cardiovascular;  Laterality: Left;   COLONOSCOPY     CORONARY ARTERY BYPASS GRAFT     NASAL FRACTURE SURGERY     TONSILLECTOMY     WISDOM TOOTH EXTRACTION      History reviewed. No pertinent family history.  No Known Allergies  CBC Latest Ref Rng & Units 02/11/2021  WBC 4.0 - 10.5 K/uL 5.7  Hemoglobin 13.0 - 17.0 g/dL 10.6(L)  Hematocrit  39.0 - 52.0 % 31.7(L)  Platelets 150 - 400 K/uL 118(L)      CMP     Component Value Date/Time   NA 136 02/11/2021 0522   K 3.7 02/11/2021 0522   CL 103 02/11/2021 0522   CO2 26 02/11/2021 0522   GLUCOSE 135 (H) 02/11/2021 0522   BUN 17 02/11/2021 0522   CREATININE 1.38 (H) 02/11/2021 0522   CALCIUM 9.1 02/11/2021 0522   GFRNONAA 60 (L) 02/11/2021 0522     No results found.     Assessment & Plan:   1. Bilateral carotid artery stenosis Recommend:  The patient is s/p successful left carotid stent  Duplex ultrasound preoperatively shows 1-39% contralateral stenosis.  Continue antiplatelet therapy as prescribed Continue management of CAD, HTN and Hyperlipidemia Healthy heart diet,  encouraged exercise at least 4 times per week  Follow up in 3 months with duplex ultrasound and physical exam based on the patient's carotid stent  2. Type 2 diabetes mellitus with stage 3 chronic kidney disease, without long-term current use of insulin, unspecified whether stage 3a or 3b CKD ([place]) Continue hypoglycemic medications as already ordered, these medications have been reviewed and there are no changes at this time.  Hgb A1C to be monitored as already arranged by primary service   3. Essential hypertension Continue antihypertensive medications as already ordered, these medications have been reviewed and there are no changes at this time.   4. Claudication [facility]) The patient does have a history of peripheral arterial disease given his carotid stenosis.  He is experiencing some claudication-like symptoms.  When he follows up in 3 months we will also check ABIs to determine if his claudication is a result of PAD or lower back issues.   Current Outpatient Medications on File Prior to Visit  Medication Sig Dispense Refill   aspirin EC 81 MG tablet Take 81 mg by mouth daily.     atorvastatin (LIPITOR) 80 MG tablet Take 80 mg by mouth at bedtime.     carvedilol (COREG) 12.5 MG tablet  Take 12.5 mg by mouth 2 (two) times daily with a meal.     clopidogrel (PLAVIX) 75 MG tablet Take 1 tablet (75 mg total) by mouth daily. 30 tablet 6   EPINEPHrine 0.3 mg/0.3 mL IJ SOAJ injection      losartan (COZAAR) 100 MG tablet Take  100 mg by mouth daily.     nitroGLYCERIN (NITROSTAT) 0.4 MG SL tablet Place under the tongue.     torsemide (DEMADEX) 20 MG tablet Take 20 mg by mouth daily.     levocetirizine (XYZAL) 5 MG tablet Take 5 mg by mouth every evening. (Patient not taking: Reported on 02/10/2021)     No current facility-administered medications on file prior to visit.    There are no Patient Instructions on file for this visit. No follow-ups on file.   Kris Hartmann, NP

## 2021-03-29 DIAGNOSIS — N1831 Chronic kidney disease, stage 3a: Secondary | ICD-10-CM | POA: Diagnosis not present

## 2021-03-29 DIAGNOSIS — I2510 Atherosclerotic heart disease of native coronary artery without angina pectoris: Secondary | ICD-10-CM | POA: Diagnosis not present

## 2021-03-29 DIAGNOSIS — E118 Type 2 diabetes mellitus with unspecified complications: Secondary | ICD-10-CM | POA: Diagnosis not present

## 2021-04-04 DIAGNOSIS — H34232 Retinal artery branch occlusion, left eye: Secondary | ICD-10-CM | POA: Diagnosis not present

## 2021-04-06 DIAGNOSIS — I779 Disorder of arteries and arterioles, unspecified: Secondary | ICD-10-CM | POA: Insufficient documentation

## 2021-04-13 DIAGNOSIS — I2510 Atherosclerotic heart disease of native coronary artery without angina pectoris: Secondary | ICD-10-CM | POA: Diagnosis not present

## 2021-04-13 DIAGNOSIS — I10 Essential (primary) hypertension: Secondary | ICD-10-CM | POA: Diagnosis not present

## 2021-04-13 DIAGNOSIS — Z23 Encounter for immunization: Secondary | ICD-10-CM | POA: Diagnosis not present

## 2021-04-13 DIAGNOSIS — Z951 Presence of aortocoronary bypass graft: Secondary | ICD-10-CM | POA: Diagnosis not present

## 2021-06-15 DIAGNOSIS — R0789 Other chest pain: Secondary | ICD-10-CM | POA: Diagnosis not present

## 2021-06-15 DIAGNOSIS — Z951 Presence of aortocoronary bypass graft: Secondary | ICD-10-CM | POA: Diagnosis not present

## 2021-06-15 DIAGNOSIS — I6522 Occlusion and stenosis of left carotid artery: Secondary | ICD-10-CM | POA: Diagnosis not present

## 2021-06-15 DIAGNOSIS — I2510 Atherosclerotic heart disease of native coronary artery without angina pectoris: Secondary | ICD-10-CM | POA: Diagnosis not present

## 2021-06-15 DIAGNOSIS — I350 Nonrheumatic aortic (valve) stenosis: Secondary | ICD-10-CM | POA: Diagnosis not present

## 2021-06-15 DIAGNOSIS — I208 Other forms of angina pectoris: Secondary | ICD-10-CM | POA: Diagnosis not present

## 2021-06-15 DIAGNOSIS — I10 Essential (primary) hypertension: Secondary | ICD-10-CM | POA: Diagnosis not present

## 2021-06-15 DIAGNOSIS — I70219 Atherosclerosis of native arteries of extremities with intermittent claudication, unspecified extremity: Secondary | ICD-10-CM | POA: Diagnosis not present

## 2021-06-15 DIAGNOSIS — I255 Ischemic cardiomyopathy: Secondary | ICD-10-CM | POA: Diagnosis not present

## 2021-06-15 DIAGNOSIS — I779 Disorder of arteries and arterioles, unspecified: Secondary | ICD-10-CM | POA: Diagnosis not present

## 2021-06-15 DIAGNOSIS — E782 Mixed hyperlipidemia: Secondary | ICD-10-CM | POA: Diagnosis not present

## 2021-06-15 DIAGNOSIS — E118 Type 2 diabetes mellitus with unspecified complications: Secondary | ICD-10-CM | POA: Diagnosis not present

## 2021-06-21 ENCOUNTER — Ambulatory Visit (INDEPENDENT_AMBULATORY_CARE_PROVIDER_SITE_OTHER): Payer: 59

## 2021-06-21 ENCOUNTER — Encounter (INDEPENDENT_AMBULATORY_CARE_PROVIDER_SITE_OTHER): Payer: Self-pay | Admitting: Vascular Surgery

## 2021-06-21 ENCOUNTER — Other Ambulatory Visit (INDEPENDENT_AMBULATORY_CARE_PROVIDER_SITE_OTHER): Payer: Self-pay | Admitting: Nurse Practitioner

## 2021-06-21 ENCOUNTER — Ambulatory Visit (INDEPENDENT_AMBULATORY_CARE_PROVIDER_SITE_OTHER): Payer: 59 | Admitting: Vascular Surgery

## 2021-06-21 VITALS — BP 120/74 | HR 60 | Resp 16 | Wt 230.0 lb

## 2021-06-21 DIAGNOSIS — I739 Peripheral vascular disease, unspecified: Secondary | ICD-10-CM

## 2021-06-21 DIAGNOSIS — E785 Hyperlipidemia, unspecified: Secondary | ICD-10-CM

## 2021-06-21 DIAGNOSIS — I6522 Occlusion and stenosis of left carotid artery: Secondary | ICD-10-CM | POA: Diagnosis not present

## 2021-06-21 DIAGNOSIS — I6523 Occlusion and stenosis of bilateral carotid arteries: Secondary | ICD-10-CM

## 2021-06-21 DIAGNOSIS — I70219 Atherosclerosis of native arteries of extremities with intermittent claudication, unspecified extremity: Secondary | ICD-10-CM | POA: Diagnosis not present

## 2021-06-21 DIAGNOSIS — E1122 Type 2 diabetes mellitus with diabetic chronic kidney disease: Secondary | ICD-10-CM | POA: Diagnosis not present

## 2021-06-21 DIAGNOSIS — I10 Essential (primary) hypertension: Secondary | ICD-10-CM

## 2021-06-21 DIAGNOSIS — N183 Chronic kidney disease, stage 3 unspecified: Secondary | ICD-10-CM

## 2021-06-21 NOTE — Progress Notes (Signed)
MRN : [id number]  Charles Ibarra is a 56 y.o. (1965/07/23) male who presents with chief complaint of  Chief Complaint  Patient presents with   Follow-up    Ultrasound follow up  .  History of Present Illness: Patient returns today in follow up of multiple vascular issues.  He is about 4 to 5 months status post left carotid stent placement.  No symptoms since the procedure.  Duplex today shows 1 to 39% right ICA stenosis and a widely patent left carotid stent without significant recurrent stenosis. More bothersome to him today is right lower extremity claudication symptoms.  He describes these as significant and lifestyle limiting currently.  They bother him every day and it reasonably short distances of walking.  No real left leg symptoms currently.  No ulceration or infection.  No rest pain.  ABIs today are 0.79 on the right and 0.97 on the left.  Right leg waveforms are monophasic whereas left leg waveforms are multiphasic.  The right digital index is also only 0.55 with a left digital index of 1.  Current Outpatient Medications  Medication Sig Dispense Refill   aspirin EC 81 MG tablet Take 81 mg by mouth daily.     atorvastatin (LIPITOR) 80 MG tablet Take 80 mg by mouth at bedtime.     carvedilol (COREG) 12.5 MG tablet Take 12.5 mg by mouth 2 (two) times daily with a meal.     clopidogrel (PLAVIX) 75 MG tablet Take 1 tablet (75 mg total) by mouth daily. 30 tablet 6   EPINEPHrine 0.3 mg/0.3 mL IJ SOAJ injection      losartan (COZAAR) 100 MG tablet Take 100 mg by mouth daily.     nitroGLYCERIN (NITROSTAT) 0.4 MG SL tablet Place under the tongue.     torsemide (DEMADEX) 20 MG tablet Take 20 mg by mouth daily.     levocetirizine (XYZAL) 5 MG tablet Take 5 mg by mouth every evening. (Patient not taking: Reported on 02/10/2021)     No current facility-administered medications for this visit.    Past Medical History:  Diagnosis Date   Alcohol abuse    Aortic stenosis     Atherosclerotic PVD with intermittent claudication (HCC)    Chronic kidney disease, stage 3 (moderate)    Coronary artery disease involving native coronary artery of native heart with angina pectoris (HCC)    HTN (hypertension)    Hyperlipidemia    Ischemic cardiomyopathy    S/P CABG (coronary artery bypass graft)     Past Surgical History:  Procedure Laterality Date   BACK SURGERY     CAROTID PTA/STENT INTERVENTION Left 02/10/2021   Procedure: CAROTID PTA/STENT INTERVENTION;  Surgeon: Algernon Huxley, MD;  Location: [facility];  Service: Cardiovascular;  Laterality: Left;   COLONOSCOPY     CORONARY ARTERY BYPASS GRAFT     NASAL FRACTURE SURGERY     TONSILLECTOMY     WISDOM TOOTH EXTRACTION      Social History         Tobacco Use   Smoking status: Former Smoker      Quit date: 04/02/2010      Years since quitting: 8.8   Smokeless tobacco: Never Used  Substance Use Topics   Alcohol use: Yes      Alcohol/week: 13.0 standard drinks      Types: 13 Cans of beer per week   Drug use: Not Currently      Family History No bleeding or  clotting disorders   No Known Allergies     REVIEW OF SYSTEMS (Negative unless checked)   Constitutional: '[]'$ Weight loss  '[]'$ Fever  '[]'$ Chills Cardiac: '[]'$ Chest pain   '[]'$ Chest pressure   '[]'$ Palpitations   '[]'$ Shortness of breath when laying flat   '[]'$ Shortness of breath at rest   '[x]'$ Shortness of breath with exertion. Vascular:  '[]'$ Pain in legs with walking   '[]'$ Pain in legs at rest   '[]'$ Pain in legs when laying flat   '[x]'$ Claudication   '[]'$ Pain in feet when walking  '[]'$ Pain in feet at rest  '[]'$ Pain in feet when laying flat   '[]'$ History of DVT   '[]'$ Phlebitis   '[]'$ Swelling in legs   '[]'$ Varicose veins   '[]'$ Non-healing ulcers Pulmonary:   '[]'$ Uses home oxygen   '[]'$ Productive cough   '[]'$ Hemoptysis   '[]'$ Wheeze  '[]'$ COPD   '[]'$ Asthma Neurologic:  '[]'$ Dizziness  '[]'$ Blackouts   '[]'$ Seizures   '[]'$ History of stroke   '[]'$ History of TIA  '[]'$ Aphasia   '[]'$ Temporary blindness   '[]'$ Dysphagia    '[]'$ Weakness or numbness in arms   '[]'$ Weakness or numbness in legs Musculoskeletal:  '[]'$ Arthritis   '[]'$ Joint swelling   '[]'$ Joint pain   '[]'$ Low back pain Hematologic:  '[]'$ Easy bruising  '[]'$ Easy bleeding   '[]'$ Hypercoagulable state   '[]'$ Anemic  '[]'$ Hepatitis Gastrointestinal:  '[]'$ Blood in stool   '[]'$ Vomiting blood  '[]'$ Gastroesophageal reflux/heartburn   '[]'$ Abdominal pain Genitourinary:  '[x]'$ Chronic kidney disease   '[]'$ Difficult urination  '[]'$ Frequent urination  '[]'$ Burning with urination   '[]'$ Hematuria Skin:  '[]'$ Rashes   '[]'$ Ulcers   '[]'$ Wounds Psychological:  '[]'$ History of anxiety   '[]'$  History of major depression.  Physical Examination  BP 120/74 (BP Location: Left Arm)   Pulse 60   Resp 16   Wt 230 lb (104.3 kg)   BMI 31.19 kg/m  Gen:  WD/WN, NAD Head: [place]/AT, No temporalis wasting. Ear/Nose/Throat: Hearing grossly intact, nares w/o erythema or drainage Eyes: Conjunctiva clear. Sclera non-icteric Neck: Supple.  Trachea midline Pulmonary:  Good air movement, no use of accessory muscles.  Cardiac: RRR, no JVD Vascular:  Vessel Right Left  Radial Palpable Palpable                          PT 1+ Palpable 2+ Palpable  DP 1+ Palpable 2+ Palpable   Gastrointestinal: soft, non-tender/non-distended. No guarding/reflex.  Musculoskeletal: M/S 5/5 throughout.  No deformity or atrophy. No edema. Neurologic: Sensation grossly intact in extremities.  Symmetrical.  Speech is fluent.  Psychiatric: Judgment intact, Mood & affect appropriate for pt's clinical situation. Dermatologic: No rashes or ulcers noted.  No cellulitis or open wounds.      Labs No results found for this or any previous visit (from the past 2160 hour(s)).  Radiology No results found.  Assessment/Plan Hyperlipidemia lipid control important in reducing the progression of atherosclerotic disease. Continue statin therapy     Essential hypertension blood pressure control important in reducing the progression of atherosclerotic disease. On  appropriate oral medications.     Diabetes ([place]) blood glucose control important in reducing the progression of atherosclerotic disease. Also, involved in wound healing. On appropriate medications.  Atherosclerotic PVD with intermittent claudication (HCC) ABIs today are 0.79 on the right and 0.97 on the left.  Right leg waveforms are monophasic whereas left leg waveforms are multiphasic.  The right digital index is also only 0.55 with a left digital index of 1. The patient is on appropriate medical therapy but has continued symptoms despite trying to walk.  He reports no limb threatening symptoms, but he desires to have intervention for lifestyle limiting claudication which is reasonable.  Risks and benefits were discussed with the patient in detail and we will proceed with right lower extremity angiogram and possible revascularization.  Carotid stenosis, left Duplex today shows 1 to 39% right ICA stenosis and a widely patent left carotid stent without significant recurrent stenosis. Continue current medical therapy.  Recheck in 6 months with duplex.    Leotis Pain, MD  06/21/2021 4:59 PM    This note was created with Dragon medical transcription system.  Any errors from dictation are purely unintentional

## 2021-06-21 NOTE — Assessment & Plan Note (Signed)
ABIs today are 0.79 on the right and 0.97 on the left.  Right leg waveforms are monophasic whereas left leg waveforms are multiphasic.  The right digital index is also only 0.55 with a left digital index of 1. The patient is on appropriate medical therapy but has continued symptoms despite trying to walk.  He reports no limb threatening symptoms, but he desires to have intervention for lifestyle limiting claudication which is reasonable.  Risks and benefits were discussed with the patient in detail and we will proceed with right lower extremity angiogram and possible revascularization.

## 2021-06-21 NOTE — Assessment & Plan Note (Signed)
Duplex today shows 1 to 39% right ICA stenosis and a widely patent left carotid stent without significant recurrent stenosis. Continue current medical therapy.  Recheck in 6 months with duplex.

## 2021-06-21 NOTE — H&P (View-Only) (Signed)
MRN : [id number]  Charles Ibarra is a 56 y.o. ([birth year]) male who presents with chief complaint of  Chief Complaint  Patient presents with   Follow-up    Ultrasound follow up  .  History of Present Illness: Patient returns today in follow up of multiple vascular issues.  He is about 4 to 5 months status post left carotid stent placement.  No symptoms since the procedure.  Duplex today shows 1 to 39% right ICA stenosis and a widely patent left carotid stent without significant recurrent stenosis. More bothersome to him today is right lower extremity claudication symptoms.  He describes these as significant and lifestyle limiting currently.  They bother him every day and it reasonably short distances of walking.  No real left leg symptoms currently.  No ulceration or infection.  No rest pain.  ABIs today are 0.79 on the right and 0.97 on the left.  Right leg waveforms are monophasic whereas left leg waveforms are multiphasic.  The right digital index is also only 0.55 with a left digital index of 1.  Current Outpatient Medications  Medication Sig Dispense Refill   aspirin EC 81 MG tablet Take 81 mg by mouth daily.     atorvastatin (LIPITOR) 80 MG tablet Take 80 mg by mouth at bedtime.     carvedilol (COREG) 12.5 MG tablet Take 12.5 mg by mouth 2 (two) times daily with a meal.     clopidogrel (PLAVIX) 75 MG tablet Take 1 tablet (75 mg total) by mouth daily. 30 tablet 6   EPINEPHrine 0.3 mg/0.3 mL IJ SOAJ injection      losartan (COZAAR) 100 MG tablet Take 100 mg by mouth daily.     nitroGLYCERIN (NITROSTAT) 0.4 MG SL tablet Place under the tongue.     torsemide (DEMADEX) 20 MG tablet Take 20 mg by mouth daily.     levocetirizine (XYZAL) 5 MG tablet Take 5 mg by mouth every evening. (Patient not taking: Reported on 02/10/2021)     No current facility-administered medications for this visit.    Past Medical History:  Diagnosis Date   Alcohol abuse    Aortic stenosis     Atherosclerotic PVD with intermittent claudication (HCC)    Chronic kidney disease, stage 3 (moderate)    Coronary artery disease involving native coronary artery of native heart with angina pectoris (HCC)    HTN (hypertension)    Hyperlipidemia    Ischemic cardiomyopathy    S/P CABG (coronary artery bypass graft)     Past Surgical History:  Procedure Laterality Date   BACK SURGERY     CAROTID PTA/STENT INTERVENTION Left 02/10/2021   Procedure: CAROTID PTA/STENT INTERVENTION;  Surgeon: Algernon Huxley, MD;  Location: [facility];  Service: Cardiovascular;  Laterality: Left;   COLONOSCOPY     CORONARY ARTERY BYPASS GRAFT     NASAL FRACTURE SURGERY     TONSILLECTOMY     WISDOM TOOTH EXTRACTION      Social History         Tobacco Use   Smoking status: Former Smoker      Quit date: 04/02/2010      Years since quitting: 8.8   Smokeless tobacco: Never Used  Substance Use Topics   Alcohol use: Yes      Alcohol/week: 13.0 standard drinks      Types: 13 Cans of beer per week   Drug use: Not Currently      Family History No bleeding or  clotting disorders   No Known Allergies     REVIEW OF SYSTEMS (Negative unless checked)   Constitutional: '[]'$ Weight loss  '[]'$ Fever  '[]'$ Chills Cardiac: '[]'$ Chest pain   '[]'$ Chest pressure   '[]'$ Palpitations   '[]'$ Shortness of breath when laying flat   '[]'$ Shortness of breath at rest   '[x]'$ Shortness of breath with exertion. Vascular:  '[]'$ Pain in legs with walking   '[]'$ Pain in legs at rest   '[]'$ Pain in legs when laying flat   '[x]'$ Claudication   '[]'$ Pain in feet when walking  '[]'$ Pain in feet at rest  '[]'$ Pain in feet when laying flat   '[]'$ History of DVT   '[]'$ Phlebitis   '[]'$ Swelling in legs   '[]'$ Varicose veins   '[]'$ Non-healing ulcers Pulmonary:   '[]'$ Uses home oxygen   '[]'$ Productive cough   '[]'$ Hemoptysis   '[]'$ Wheeze  '[]'$ COPD   '[]'$ Asthma Neurologic:  '[]'$ Dizziness  '[]'$ Blackouts   '[]'$ Seizures   '[]'$ History of stroke   '[]'$ History of TIA  '[]'$ Aphasia   '[]'$ Temporary blindness   '[]'$ Dysphagia    '[]'$ Weakness or numbness in arms   '[]'$ Weakness or numbness in legs Musculoskeletal:  '[]'$ Arthritis   '[]'$ Joint swelling   '[]'$ Joint pain   '[]'$ Low back pain Hematologic:  '[]'$ Easy bruising  '[]'$ Easy bleeding   '[]'$ Hypercoagulable state   '[]'$ Anemic  '[]'$ Hepatitis Gastrointestinal:  '[]'$ Blood in stool   '[]'$ Vomiting blood  '[]'$ Gastroesophageal reflux/heartburn   '[]'$ Abdominal pain Genitourinary:  '[x]'$ Chronic kidney disease   '[]'$ Difficult urination  '[]'$ Frequent urination  '[]'$ Burning with urination   '[]'$ Hematuria Skin:  '[]'$ Rashes   '[]'$ Ulcers   '[]'$ Wounds Psychological:  '[]'$ History of anxiety   '[]'$  History of major depression.  Physical Examination  BP 120/74 (BP Location: Left Arm)   Pulse 60   Resp 16   Wt 230 lb (104.3 kg)   BMI 31.19 kg/m  Gen:  WD/WN, NAD Head: [place]/AT, No temporalis wasting. Ear/Nose/Throat: Hearing grossly intact, nares w/o erythema or drainage Eyes: Conjunctiva clear. Sclera non-icteric Neck: Supple.  Trachea midline Pulmonary:  Good air movement, no use of accessory muscles.  Cardiac: RRR, no JVD Vascular:  Vessel Right Left  Radial Palpable Palpable                          PT 1+ Palpable 2+ Palpable  DP 1+ Palpable 2+ Palpable   Gastrointestinal: soft, non-tender/non-distended. No guarding/reflex.  Musculoskeletal: M/S 5/5 throughout.  No deformity or atrophy. No edema. Neurologic: Sensation grossly intact in extremities.  Symmetrical.  Speech is fluent.  Psychiatric: Judgment intact, Mood & affect appropriate for pt's clinical situation. Dermatologic: No rashes or ulcers noted.  No cellulitis or open wounds.      Labs No results found for this or any previous visit (from the past 2160 hour(s)).  Radiology No results found.  Assessment/Plan Hyperlipidemia lipid control important in reducing the progression of atherosclerotic disease. Continue statin therapy     Essential hypertension blood pressure control important in reducing the progression of atherosclerotic disease. On  appropriate oral medications.     Diabetes ([place]) blood glucose control important in reducing the progression of atherosclerotic disease. Also, involved in wound healing. On appropriate medications.  Atherosclerotic PVD with intermittent claudication (HCC) ABIs today are 0.79 on the right and 0.97 on the left.  Right leg waveforms are monophasic whereas left leg waveforms are multiphasic.  The right digital index is also only 0.55 with a left digital index of 1. The patient is on appropriate medical therapy but has continued symptoms despite trying to walk.  He reports no limb threatening symptoms, but he desires to have intervention for lifestyle limiting claudication which is reasonable.  Risks and benefits were discussed with the patient in detail and we will proceed with right lower extremity angiogram and possible revascularization.  Carotid stenosis, left Duplex today shows 1 to 39% right ICA stenosis and a widely patent left carotid stent without significant recurrent stenosis. Continue current medical therapy.  Recheck in 6 months with duplex.    Leotis Pain, MD  06/21/2021 4:59 PM    This note was created with Dragon medical transcription system.  Any errors from dictation are purely unintentional

## 2021-06-30 ENCOUNTER — Telehealth (INDEPENDENT_AMBULATORY_CARE_PROVIDER_SITE_OTHER): Payer: Self-pay

## 2021-06-30 NOTE — Telephone Encounter (Signed)
Spoke with the patient and he is scheduled with Dr. Lucky Cowboy for a right leg angio on 07/11/21 with a 8:15 am arrival time to the [facility]. Pre-procedure instructions were discussed and will be mailed.

## 2021-07-04 DIAGNOSIS — H34232 Retinal artery branch occlusion, left eye: Secondary | ICD-10-CM | POA: Diagnosis not present

## 2021-07-07 DIAGNOSIS — I255 Ischemic cardiomyopathy: Secondary | ICD-10-CM | POA: Diagnosis not present

## 2021-07-07 DIAGNOSIS — Z951 Presence of aortocoronary bypass graft: Secondary | ICD-10-CM | POA: Diagnosis not present

## 2021-07-07 DIAGNOSIS — I208 Other forms of angina pectoris: Secondary | ICD-10-CM | POA: Diagnosis not present

## 2021-07-07 DIAGNOSIS — I2510 Atherosclerotic heart disease of native coronary artery without angina pectoris: Secondary | ICD-10-CM | POA: Diagnosis not present

## 2021-07-11 ENCOUNTER — Encounter
Admission: RE | Disposition: A | Discharge status: Home / Self Care | Payer: Self-pay | Source: Home / Self Care | Attending: Vascular Surgery

## 2021-07-11 ENCOUNTER — Other Ambulatory Visit: Payer: Self-pay

## 2021-07-11 ENCOUNTER — Ambulatory Visit
Admission: RE | Admit: 2021-07-11 | Discharge: 2021-07-11 | Disposition: A | Discharge status: Home / Self Care | Payer: 59 | Attending: Vascular Surgery | Admitting: Vascular Surgery

## 2021-07-11 ENCOUNTER — Encounter: Payer: Self-pay | Admitting: Vascular Surgery

## 2021-07-11 DIAGNOSIS — N183 Chronic kidney disease, stage 3 unspecified: Secondary | ICD-10-CM | POA: Insufficient documentation

## 2021-07-11 DIAGNOSIS — E785 Hyperlipidemia, unspecified: Secondary | ICD-10-CM | POA: Insufficient documentation

## 2021-07-11 DIAGNOSIS — Z87891 Personal history of nicotine dependence: Secondary | ICD-10-CM | POA: Insufficient documentation

## 2021-07-11 DIAGNOSIS — I70211 Atherosclerosis of native arteries of extremities with intermittent claudication, right leg: Secondary | ICD-10-CM | POA: Diagnosis not present

## 2021-07-11 DIAGNOSIS — I6523 Occlusion and stenosis of bilateral carotid arteries: Secondary | ICD-10-CM | POA: Diagnosis not present

## 2021-07-11 DIAGNOSIS — E1122 Type 2 diabetes mellitus with diabetic chronic kidney disease: Secondary | ICD-10-CM | POA: Diagnosis not present

## 2021-07-11 DIAGNOSIS — I70219 Atherosclerosis of native arteries of extremities with intermittent claudication, unspecified extremity: Secondary | ICD-10-CM

## 2021-07-11 DIAGNOSIS — E1151 Type 2 diabetes mellitus with diabetic peripheral angiopathy without gangrene: Secondary | ICD-10-CM | POA: Insufficient documentation

## 2021-07-11 DIAGNOSIS — I129 Hypertensive chronic kidney disease with stage 1 through stage 4 chronic kidney disease, or unspecified chronic kidney disease: Secondary | ICD-10-CM | POA: Diagnosis not present

## 2021-07-11 HISTORY — PX: LOWER EXTREMITY ANGIOGRAPHY: CATH118251

## 2021-07-11 LAB — CREATININE, SERUM
Creatinine, Ser: 1.2 mg/dL (ref 0.61–1.24)
GFR, Estimated: >60 mL/min (ref >60)

## 2021-07-11 LAB — BUN: BUN: 23 mg/dL — ABNORMAL HIGH (ref 6–20)

## 2021-07-11 SURGERY — LOWER EXTREMITY ANGIOGRAPHY
Anesthesia: Moderate Sedation | Site: Leg Lower | Laterality: Right

## 2021-07-11 MED ORDER — MIDAZOLAM HCL 2 MG/2ML IJ SOLN
INTRAMUSCULAR | Status: AC
  Filled 2021-07-11: qty 2

## 2021-07-11 MED ORDER — HEPARIN SODIUM (PORCINE) 1000 UNIT/ML IJ SOLN
INTRAMUSCULAR | Status: AC
  Filled 2021-07-11: qty 10

## 2021-07-11 MED ORDER — CEFAZOLIN SODIUM-DEXTROSE 2-4 GM/100ML-% IV SOLN
2.0000 g | INTRAVENOUS | Status: AC
  Administered 2021-07-11: 2 g via INTRAVENOUS

## 2021-07-11 MED ORDER — HYDRALAZINE HCL 20 MG/ML IJ SOLN: 5.0000 mg | INTRAMUSCULAR | Status: DC | PRN

## 2021-07-11 MED ORDER — FENTANYL CITRATE (PF) 100 MCG/2ML IJ SOLN
INTRAMUSCULAR | Status: AC
  Filled 2021-07-11: qty 2

## 2021-07-11 MED ORDER — ACETAMINOPHEN 325 MG PO TABS: 650.0000 mg | ORAL_TABLET | ORAL | Status: DC | PRN

## 2021-07-11 MED ORDER — DIPHENHYDRAMINE HCL 50 MG/ML IJ SOLN: 50.0000 mg | Freq: Once | INTRAMUSCULAR | Status: DC | PRN

## 2021-07-11 MED ORDER — HYDROMORPHONE HCL 1 MG/ML IJ SOLN: 1.0000 mg | Freq: Once | INTRAMUSCULAR | Status: DC | PRN

## 2021-07-11 MED ORDER — FENTANYL CITRATE PF 50 MCG/ML IJ SOSY
PREFILLED_SYRINGE | INTRAMUSCULAR | Status: AC
  Filled 2021-07-11: qty 1

## 2021-07-11 MED ORDER — FAMOTIDINE 20 MG PO TABS: 40.0000 mg | ORAL_TABLET | Freq: Once | ORAL | Status: DC | PRN

## 2021-07-11 MED ORDER — METHYLPREDNISOLONE SODIUM SUCC 125 MG IJ SOLR: 125.0000 mg | Freq: Once | INTRAMUSCULAR | Status: DC | PRN

## 2021-07-11 MED ORDER — MIDAZOLAM HCL 2 MG/2ML IJ SOLN
INTRAMUSCULAR | Status: DC | PRN
  Administered 2021-07-11: 2 mg via INTRAVENOUS
  Administered 2021-07-11 (×2): 1 mg via INTRAVENOUS

## 2021-07-11 MED ORDER — SODIUM CHLORIDE 0.9 % IV SOLN: INTRAVENOUS | Status: DC

## 2021-07-11 MED ORDER — SODIUM CHLORIDE 0.9% FLUSH: 3.0000 mL | Freq: Two times a day (BID) | INTRAVENOUS | Status: DC

## 2021-07-11 MED ORDER — HEPARIN SODIUM (PORCINE) 1000 UNIT/ML IJ SOLN
INTRAMUSCULAR | Status: DC | PRN
  Administered 2021-07-11: 5000 [IU] via INTRAVENOUS

## 2021-07-11 MED ORDER — MIDAZOLAM HCL 2 MG/ML PO SYRP: 8.0000 mg | ORAL_SOLUTION | Freq: Once | ORAL | Status: DC | PRN

## 2021-07-11 MED ORDER — LABETALOL HCL 5 MG/ML IV SOLN: 10.0000 mg | INTRAVENOUS | Status: DC | PRN

## 2021-07-11 MED ORDER — ONDANSETRON HCL 4 MG/2ML IJ SOLN
4.0000 mg | Freq: Four times a day (QID) | INTRAMUSCULAR | Status: DC | PRN
Start: 2021-07-11 — End: 2021-07-11

## 2021-07-11 MED ORDER — SODIUM CHLORIDE 0.9% FLUSH: 3.0000 mL | INTRAVENOUS | Status: DC | PRN

## 2021-07-11 MED ORDER — SODIUM CHLORIDE 0.9 % IV SOLN: 250.0000 mL | INTRAVENOUS | Status: DC | PRN

## 2021-07-11 MED ORDER — ONDANSETRON HCL 4 MG/2ML IJ SOLN: 4.0000 mg | Freq: Four times a day (QID) | INTRAMUSCULAR | Status: DC | PRN

## 2021-07-11 MED ORDER — FENTANYL CITRATE (PF) 100 MCG/2ML IJ SOLN
INTRAMUSCULAR | Status: DC | PRN
  Administered 2021-07-11: 25 ug via INTRAVENOUS
  Administered 2021-07-11: 50 ug via INTRAVENOUS
  Administered 2021-07-11 (×2): 25 ug via INTRAVENOUS

## 2021-07-11 SURGICAL SUPPLY — 22 items
BALLN DORADO 6X200X135 (BALLOONS) ×4
BALLN LUTONIX 018 4X220X130 (BALLOONS) ×2
BALLN LUTONIX 018 5X220X130 (BALLOONS) ×2
BALLOON DORADO 6X200X135 (BALLOONS) IMPLANT
BALLOON LUTONIX 018 4X220X130 (BALLOONS) IMPLANT
BALLOON LUTONIX 018 5X220X130 (BALLOONS) IMPLANT
CATH ANGIO 5F PIGTAIL 65CM (CATHETERS) ×1 IMPLANT
CATH BEACON 5 .038 100 VERT TP (CATHETERS) ×1 IMPLANT
COVER PROBE U/S 5X48 (MISCELLANEOUS) ×1 IMPLANT
DEVICE STARCLOSE SE CLOSURE (Vascular Products) ×1 IMPLANT
DEVICE TORQUE (MISCELLANEOUS) ×1 IMPLANT
GLIDEWIRE ADV .035X260CM (WIRE) ×1 IMPLANT
GUIDEWIRE PFTE-COATED .018X300 (WIRE) ×1 IMPLANT
KIT ENCORE 26 ADVANTAGE (KITS) ×1 IMPLANT
PACK ANGIOGRAPHY (CUSTOM PROCEDURE TRAY) ×2 IMPLANT
SHEATH ANL2 6FRX45 HC (SHEATH) ×1 IMPLANT
SHEATH BRITE TIP 5FRX11 (SHEATH) ×1 IMPLANT
STENT VIABAHN 6X250X120 (Permanent Stent) ×2 IMPLANT
SYR MEDRAD MARK 7 150ML (SYRINGE) ×1 IMPLANT
TUBING CONTRAST HIGH PRESS 72 (TUBING) ×1 IMPLANT
WIRE G V18X300CM (WIRE) ×1 IMPLANT
WIRE GUIDERIGHT .035X150 (WIRE) ×1 IMPLANT

## 2021-07-11 NOTE — Progress Notes (Signed)
Dr. Dew at bedside, speaking with pt. Re: procedural results. Pt. Verbalized understanding of conversation.  

## 2021-07-11 NOTE — Interval H&P Note (Signed)
History and Physical Interval Note:  07/11/2021 8:04 AM  Charles Ibarra  has presented today for surgery, with the diagnosis of RLE Angio   BARD   ASO w claudication.  The various methods of treatment have been discussed with the patient and family. After consideration of risks, benefits and other options for treatment, the patient has consented to  Procedure(s): Lower Extremity Angiography (Right) as a surgical intervention.  [name] history has been reviewed, patient examined, no change in status, stable for surgery.  I have reviewed the patient's chart and labs.  Questions were answered to the patient's satisfaction.     Leotis Pain

## 2021-07-11 NOTE — Op Note (Signed)
[facility]  Percutaneous Study/Intervention Procedural Note   Date of Surgery: 07/11/2021  Surgeon(s):Gladys Gutman    Assistants:none  Pre-operative Diagnosis: PAD with claudication right lower extremity  Post-operative diagnosis:  Same  Procedure(s) Performed:             1.  Ultrasound guidance for vascular access left femoral artery             2.  Catheter placement into right common femoral artery from left femoral approach             3.  Aortogram and selective right lower extremity angiogram             4.  Percutaneous transluminal angioplasty of right SFA and above-knee popliteal artery with 5 mm diam Lutonix drug-coated angioplasty balloon proximally 4 mm diam Lutonix drug-coated angioplasty balloon distally             5.  Viabahn stent placement x2 with a pair of 6 mm diameter by 25 cm length stents in the right SFA and popliteal arteries  6.   StarClose closure device left femoral artery  EBL: 10 cc  Contrast: 65 cc  Fluoro Time: 16.7 minutes  Moderate Conscious Sedation Time: approximately 72 minutes using 4 mg of Versed and 125 mcg of Fentanyl              Indications:  Patient is a 56 y.o.male with severe claudication symptoms of the right lower extremity. The patient has noninvasive study showing reduced perfusion on the right. The patient is brought in for angiography for further evaluation and potential treatment. Risks and benefits are discussed and informed consent is obtained.   Procedure:  The patient was identified and appropriate procedural time out was performed.  The patient was then placed supine on the table and prepped and draped in the usual sterile fashion. Moderate conscious sedation was administered during a face to face encounter with the patient throughout the procedure with my supervision of the RN administering medicines and monitoring the patient's vital signs, pulse oximetry, telemetry and mental status throughout from the  start of the procedure until the patient was taken to the recovery room. Ultrasound was used to evaluate the left common femoral artery.  It was patent .  A digital ultrasound image was acquired.  A Seldinger needle was used to access the left common femoral artery under direct ultrasound guidance and a permanent image was performed.  A 0.035 J wire was advanced without resistance and a 5Fr sheath was placed.  Pigtail catheter was placed into the aorta and an AP aortogram was performed. This demonstrated normal renal arteries and normal aorta and iliac segments without significant stenosis. I then crossed the aortic bifurcation and advanced to the right femoral head. Selective right lower extremity angiogram was then performed. This demonstrated a fairly normal common femoral artery and profunda femoris artery with a diffusely diseased SFA with multiple areas of high-grade stenosis as well as occlusion down to the above-knee popliteal artery.  The below-knee popliteal artery was fairly normal and there was three-vessel runoff distally without significant tibial disease. It was felt that it was in the patient's best interest to proceed with intervention after these images to avoid a second procedure and a larger amount of contrast and fluoroscopy based off of the findings from the initial angiogram. The patient was systemically heparinized and a 6 [place] Ansell sheath was then placed over the [employer] wire. I then used a  Kumpe catheter and the advantage wire to navigate through the occlusion in the right SFA and above-knee popliteal artery and confirm intraluminal flow in the below-knee popliteal artery.  This was somewhat tedious but ultimately were able to gain luminal access in the popliteal artery.  I exchanged for a V18 wire and balloon in the popliteal and distal SFA with a 4 mm diameter by 22 cm length Lutonix drug-coated angioplasty balloon in the proximal SFA and mid SFA with a 5 mm diameter by 22 cm  length Lutonix drug-coated angioplasty balloon.  Significant residual stenosis was there throughout so 2 stents were required to treat the entirety of the lesion.  First, a 6 mm diameter by 25 cm length Viabahn stent was deployed from the mid popliteal up to the mid to distal SFA.  An additional 6 mm diameter by 25 cm length Viabahn stent was then deployed from this below the origin of the SFA down to the initial stent.  These were postdilated with a 6 mm balloon with excellent angiographic completion result and less than 10% residual stenosis. I elected to terminate the procedure. The sheath was removed and StarClose closure device was deployed in the left femoral artery with excellent hemostatic result. The patient was taken to the recovery room in stable condition having tolerated the procedure well.  Findings:               Aortogram:  This demonstrated normal renal arteries and normal aorta and iliac segments without significant stenosis.             Right Lower Extremity: This demonstrated a fairly normal common femoral artery and profunda femoris artery with a diffusely diseased SFA with multiple areas of high-grade stenosis as well as occlusion down to the above-knee popliteal artery.  The below-knee popliteal artery was fairly normal and there was three-vessel runoff distally without significant tibial disease   Disposition: Patient was taken to the recovery room in stable condition having tolerated the procedure well.  Complications: None  Charles Ibarra 07/11/2021 10:43 AM   This note was created with Dragon Medical transcription system. Any errors in dictation are purely unintentional.

## 2021-07-14 DIAGNOSIS — I10 Essential (primary) hypertension: Secondary | ICD-10-CM | POA: Diagnosis not present

## 2021-07-14 DIAGNOSIS — I208 Other forms of angina pectoris: Secondary | ICD-10-CM | POA: Diagnosis not present

## 2021-07-14 DIAGNOSIS — I2510 Atherosclerotic heart disease of native coronary artery without angina pectoris: Secondary | ICD-10-CM | POA: Diagnosis not present

## 2021-07-14 DIAGNOSIS — I255 Ischemic cardiomyopathy: Secondary | ICD-10-CM | POA: Diagnosis not present

## 2021-07-14 DIAGNOSIS — I6522 Occlusion and stenosis of left carotid artery: Secondary | ICD-10-CM | POA: Diagnosis not present

## 2021-07-14 DIAGNOSIS — Z951 Presence of aortocoronary bypass graft: Secondary | ICD-10-CM | POA: Diagnosis not present

## 2021-07-14 DIAGNOSIS — E782 Mixed hyperlipidemia: Secondary | ICD-10-CM | POA: Diagnosis not present

## 2021-07-14 DIAGNOSIS — E118 Type 2 diabetes mellitus with unspecified complications: Secondary | ICD-10-CM | POA: Diagnosis not present

## 2021-07-14 DIAGNOSIS — I779 Disorder of arteries and arterioles, unspecified: Secondary | ICD-10-CM | POA: Diagnosis not present

## 2021-07-14 DIAGNOSIS — N1831 Chronic kidney disease, stage 3a: Secondary | ICD-10-CM | POA: Diagnosis not present

## 2021-07-14 DIAGNOSIS — I350 Nonrheumatic aortic (valve) stenosis: Secondary | ICD-10-CM | POA: Diagnosis not present

## 2021-07-14 DIAGNOSIS — I70219 Atherosclerosis of native arteries of extremities with intermittent claudication, unspecified extremity: Secondary | ICD-10-CM | POA: Diagnosis not present

## 2021-08-06 ENCOUNTER — Other Ambulatory Visit (INDEPENDENT_AMBULATORY_CARE_PROVIDER_SITE_OTHER): Payer: Self-pay | Admitting: Vascular Surgery

## 2021-08-09 ENCOUNTER — Other Ambulatory Visit (INDEPENDENT_AMBULATORY_CARE_PROVIDER_SITE_OTHER): Payer: Self-pay | Admitting: Vascular Surgery

## 2021-08-09 DIAGNOSIS — I70211 Atherosclerosis of native arteries of extremities with intermittent claudication, right leg: Secondary | ICD-10-CM

## 2021-08-09 DIAGNOSIS — Z95820 Peripheral vascular angioplasty status with implants and grafts: Secondary | ICD-10-CM

## 2021-08-10 ENCOUNTER — Encounter (INDEPENDENT_AMBULATORY_CARE_PROVIDER_SITE_OTHER): Payer: Self-pay | Admitting: Nurse Practitioner

## 2021-08-10 ENCOUNTER — Ambulatory Visit (INDEPENDENT_AMBULATORY_CARE_PROVIDER_SITE_OTHER): Payer: 59

## 2021-08-10 ENCOUNTER — Ambulatory Visit (INDEPENDENT_AMBULATORY_CARE_PROVIDER_SITE_OTHER): Payer: 59 | Admitting: Nurse Practitioner

## 2021-08-10 VITALS — BP 112/77 | HR 69 | Ht 72.0 in | Wt 227.0 lb

## 2021-08-10 DIAGNOSIS — I6523 Occlusion and stenosis of bilateral carotid arteries: Secondary | ICD-10-CM

## 2021-08-10 DIAGNOSIS — I10 Essential (primary) hypertension: Secondary | ICD-10-CM | POA: Diagnosis not present

## 2021-08-10 DIAGNOSIS — Z95820 Peripheral vascular angioplasty status with implants and grafts: Secondary | ICD-10-CM

## 2021-08-10 DIAGNOSIS — I70219 Atherosclerosis of native arteries of extremities with intermittent claudication, unspecified extremity: Secondary | ICD-10-CM | POA: Diagnosis not present

## 2021-08-10 DIAGNOSIS — I70211 Atherosclerosis of native arteries of extremities with intermittent claudication, right leg: Secondary | ICD-10-CM | POA: Diagnosis not present

## 2021-08-28 ENCOUNTER — Encounter (INDEPENDENT_AMBULATORY_CARE_PROVIDER_SITE_OTHER): Payer: Self-pay | Admitting: Nurse Practitioner

## 2021-08-28 NOTE — Progress Notes (Signed)
Subjective:    Patient ID: Charles Ibarra, male    DOB: Dec 04, 1965, 56 y.o.   MRN: [id number] Chief Complaint  Patient presents with   Follow-up    Patient in today to check doppler results    The patient returns to the office for followup and review status post angiogram with intervention on 07/11/2021.   Procedure: Procedure(s) Performed:             1.  Ultrasound guidance for vascular access left femoral artery             2.  Catheter placement into right common femoral artery from left femoral approach             3.  Aortogram and selective right lower extremity angiogram             4.  Percutaneous transluminal angioplasty of right SFA and above-knee popliteal artery with 5 mm diam Lutonix drug-coated angioplasty balloon proximally 4 mm diam Lutonix drug-coated angioplasty balloon distally             5.  Viabahn stent placement x2 with a pair of 6 mm diameter by 25 cm length stents in the right SFA and popliteal arteries             6.   StarClose closure device left femoral artery  The patient notes improvement in the lower extremity symptoms. No interval shortening of the patient's claudication distance or rest pain symptoms. No new ulcers or wounds have occurred since the last visit.  There have been no significant changes to the patient's overall health care.  No documented history of amaurosis fugax or recent TIA symptoms. There are no recent neurological changes noted. No documented history of DVT, PE or superficial thrombophlebitis. The patient denies recent episodes of angina or shortness of breath.   ABI's Rt=0.1.07 and Lt=0.99  (previous ABI's Rt=0.79 and Lt=0.97) Duplex US of the bilateral tibial arteries reveals triphasic waveforms with good toe waveforms.    Review of Systems  All other systems reviewed and are negative.      Objective:   Physical Exam Vitals reviewed.  HENT:     Head: Normocephalic.  Cardiovascular:     Rate and Rhythm: Normal  rate.     Pulses: Normal pulses.  Pulmonary:     Effort: Pulmonary effort is normal.  Skin:    General: Skin is warm and dry.  Neurological:     Mental Status: He is alert and oriented to person, place, and time.  Psychiatric:        Mood and Affect: Mood normal.        Behavior: Behavior normal.        Thought Content: Thought content normal.        Judgment: Judgment normal.     BP 112/77   Pulse 69   Ht 6' (1.829 m)   Wt 227 lb (103 kg)   BMI 30.79 kg/m   Past Medical History:  Diagnosis Date   Alcohol abuse    Aortic stenosis    Atherosclerotic PVD with intermittent claudication (HCC)    Chronic kidney disease, stage 3 (moderate)    Coronary artery disease involving native coronary artery of native heart with angina pectoris (HCC)    HTN (hypertension)    Hyperlipidemia    Ischemic cardiomyopathy    S/P CABG (coronary artery bypass graft)     Social History   Socioeconomic History   Marital status: Divorced  Spouse name: Not on file   Number of children: 1   Years of education: Not on file   Highest education level: Not on file  Occupational History   Not on file  Tobacco Use   Smoking status: Former    Types: Cigarettes    Quit date: 04/02/2010    Years since quitting: 11.4   Smokeless tobacco: Never  Substance and Sexual Activity   Alcohol use: Not Currently    Alcohol/week: 1.0 standard drink of alcohol    Types: 1 Glasses of wine per week    Comment: 6 cand beer/week   Drug use: Not Currently   Sexual activity: Not Currently  Other Topics Concern   Not on file  Social History Narrative   Daughter , who lives in [place], [place]   Social Determinants of Health   Financial Resource Strain: Not on file  Food Insecurity: Not on file  Transportation Needs: Not on file  Physical Activity: Not on file  Stress: Not on file  Social Connections: Not on file  Intimate Partner Violence: Not on file    Past Surgical History:  Procedure Laterality  Date   BACK SURGERY     CAROTID PTA/STENT INTERVENTION Left 02/10/2021   Procedure: CAROTID PTA/STENT INTERVENTION;  Surgeon: Algernon Huxley, MD;  Location: [facility];  Service: Cardiovascular;  Laterality: Left;   COLONOSCOPY     CORONARY ARTERY BYPASS GRAFT     LOWER EXTREMITY ANGIOGRAPHY Right 07/11/2021   Procedure: Lower Extremity Angiography;  Surgeon: Algernon Huxley, MD;  Location: [facility];  Service: Cardiovascular;  Laterality: Right;   NASAL FRACTURE SURGERY     TONSILLECTOMY     WISDOM TOOTH EXTRACTION      History reviewed. No pertinent family history.  No Known Allergies     Latest Ref Rng & Units 02/11/2021    5:22 AM  CBC  WBC 4.0 - 10.5 K/uL 5.7   Hemoglobin 13.0 - 17.0 g/dL 10.6   Hematocrit 39.0 - 52.0 % 31.7   Platelets 150 - 400 K/uL 118       CMP     Component Value Date/Time   NA 136 02/11/2021 0522   K 3.7 02/11/2021 0522   CL 103 02/11/2021 0522   CO2 26 02/11/2021 0522   GLUCOSE 135 (H) 02/11/2021 0522   BUN 23 (H) 07/11/2021 0804   CREATININE 1.20 07/11/2021 0804   CALCIUM 9.1 02/11/2021 0522   GFRNONAA >60 07/11/2021 0804     VAS [place] ABI WITH/WO TBI  Result Date: 08/12/2021  LOWER EXTREMITY DOPPLER STUDY Patient Name:  Charles Ibarra  Date of Exam:   08/10/2021 Medical Rec #: [id number]         Accession #:    [id number] Date of Birth: [birth year]        Patient Gender: M Patient Age:   56 years Exam Location:  [facility] Procedure:      VAS [place] ABI WITH/WO TBI Referring Phys: Leotis Pain --------------------------------------------------------------------------------  Indications: Claudication.  Vascular Interventions: 07/11/2021: Aortogram and Selctive Right Lower Extremity                         Angiogram. PTA of the Right SFA and above knee Popliteal                         Artery with 5 mm diameter Lutonix drug coated  angioplasty balloon distally. Viabahn Stent placement x2                          with a pair of 6 mm diameter by 25 cm length stents in                         the Right SFA and Popliteal Artery. Comparison Study: 06/21/2021 Performing Technologist: Almira Coaster RVS  Examination Guidelines: A complete evaluation includes at minimum, Doppler waveform signals and systolic blood pressure reading at the level of bilateral brachial, anterior tibial, and posterior tibial arteries, when vessel segments are accessible. Bilateral testing is considered an integral part of a complete examination. Photoelectric Plethysmograph (PPG) waveforms and toe systolic pressure readings are included as required and additional duplex testing as needed. Limited examinations for reoccurring indications may be performed as noted.  ABI Findings: +---------+------------------+-----+---------+--------+ Right    Rt Pressure (mmHg)IndexWaveform Comment  +---------+------------------+-----+---------+--------+ Brachial 115                                      +---------+------------------+-----+---------+--------+ ATA      138               1.07 triphasic         +---------+------------------+-----+---------+--------+ PTA      121               0.94 triphasic         +---------+------------------+-----+---------+--------+ Great Toe105               0.81 Normal            +---------+------------------+-----+---------+--------+ +---------+------------------+-----+---------+-------+ Left     Lt Pressure (mmHg)IndexWaveform Comment +---------+------------------+-----+---------+-------+ Brachial 129                                     +---------+------------------+-----+---------+-------+ ATA      128               0.99 triphasic        +---------+------------------+-----+---------+-------+ PTA      118               0.91 triphasic        +---------+------------------+-----+---------+-------+ Great Toe75                0.58 Abnormal          +---------+------------------+-----+---------+-------+ +-------+-----------+-----------+------------+------------+ ABI/TBIToday's ABIToday's TBIPrevious ABIPrevious TBI +-------+-----------+-----------+------------+------------+ Right  1.07       .81        .79         .55          +-------+-----------+-----------+------------+------------+ Left   .99        .58        .97         1.01         +-------+-----------+-----------+------------+------------+ Right ABIs and TBIs appear increased compared to prior study on 06/21/2021. Left ABIs appear essentially unchanged compared to prior study on 06/21/2021. Left TBIs appear decreased compared to prior study on 06/21/2021.  Summary: Right: Resting right ankle-brachial index is within normal range. No evidence of significant right lower extremity arterial disease. The right toe-brachial index is normal. Left: Resting left ankle-brachial index is within normal range. No evidence of significant  left lower extremity arterial disease. The left toe-brachial index is abnormal.  *See table(s) above for measurements and observations.  Electronically signed by Leotis Pain MD on 08/12/2021 at 8:47:26 AM.    Final        Assessment & Plan:   1. Bilateral carotid artery stenosis No new TIA or amaurosis fugax-like symptoms.  Patient will maintain follow-up schedule from previous stent placement.  2. Essential hypertension Continue antihypertensive medications as already ordered, these medications have been reviewed and there are no changes at this time.   3. Atherosclerotic PVD with intermittent claudication (HCC)  Recommend:  The patient has evidence of atherosclerosis of the lower extremities with claudication.  The patient does not voice lifestyle limiting changes at this point in time.  Noninvasive studies do not suggest clinically significant change.  No invasive studies, angiography or surgery at this time The patient should continue walking  and begin a more formal exercise program.  The patient should continue antiplatelet therapy and aggressive treatment of the lipid abnormalities  No changes in the patient's medications at this time  Continued surveillance is indicated as atherosclerosis is likely to progress with time.    The patient will continue follow up with noninvasive studies as ordered.     Current Outpatient Medications on File Prior to Visit  Medication Sig Dispense Refill   aspirin EC 81 MG tablet Take 81 mg by mouth daily.     atorvastatin (LIPITOR) 80 MG tablet Take 80 mg by mouth at bedtime.     carvedilol (COREG) 12.5 MG tablet Take 12.5 mg by mouth 2 (two) times daily with a meal.     clopidogrel (PLAVIX) 75 MG tablet Take 1 tablet by mouth once daily 90 tablet 0   losartan (COZAAR) 100 MG tablet Take 100 mg by mouth daily.     torsemide (DEMADEX) 20 MG tablet Take 20 mg by mouth daily.     EPINEPHrine 0.3 mg/0.3 mL IJ SOAJ injection      levocetirizine (XYZAL) 5 MG tablet Take 5 mg by mouth every evening.     nitroGLYCERIN (NITROSTAT) 0.4 MG SL tablet Place under the tongue.     No current facility-administered medications on file prior to visit.    There are no Patient Instructions on file for this visit. No follow-ups on file.   Kris Hartmann, NP

## 2021-09-27 DIAGNOSIS — I2510 Atherosclerotic heart disease of native coronary artery without angina pectoris: Secondary | ICD-10-CM | POA: Diagnosis not present

## 2021-09-27 DIAGNOSIS — E118 Type 2 diabetes mellitus with unspecified complications: Secondary | ICD-10-CM | POA: Diagnosis not present

## 2021-10-05 DIAGNOSIS — I70219 Atherosclerosis of native arteries of extremities with intermittent claudication, unspecified extremity: Secondary | ICD-10-CM | POA: Diagnosis not present

## 2021-10-05 DIAGNOSIS — I701 Atherosclerosis of renal artery: Secondary | ICD-10-CM | POA: Diagnosis not present

## 2021-10-05 DIAGNOSIS — E118 Type 2 diabetes mellitus with unspecified complications: Secondary | ICD-10-CM | POA: Diagnosis not present

## 2021-10-05 DIAGNOSIS — I10 Essential (primary) hypertension: Secondary | ICD-10-CM | POA: Diagnosis not present

## 2021-10-05 DIAGNOSIS — N1831 Chronic kidney disease, stage 3a: Secondary | ICD-10-CM | POA: Diagnosis not present

## 2021-10-05 DIAGNOSIS — I255 Ischemic cardiomyopathy: Secondary | ICD-10-CM | POA: Diagnosis not present

## 2021-10-05 DIAGNOSIS — I779 Disorder of arteries and arterioles, unspecified: Secondary | ICD-10-CM | POA: Diagnosis not present

## 2021-10-05 DIAGNOSIS — I2510 Atherosclerotic heart disease of native coronary artery without angina pectoris: Secondary | ICD-10-CM | POA: Diagnosis not present

## 2021-10-19 DIAGNOSIS — E1151 Type 2 diabetes mellitus with diabetic peripheral angiopathy without gangrene: Secondary | ICD-10-CM | POA: Diagnosis not present

## 2021-10-19 DIAGNOSIS — N529 Male erectile dysfunction, unspecified: Secondary | ICD-10-CM | POA: Diagnosis not present

## 2021-10-19 DIAGNOSIS — E669 Obesity, unspecified: Secondary | ICD-10-CM | POA: Diagnosis not present

## 2021-10-19 DIAGNOSIS — E785 Hyperlipidemia, unspecified: Secondary | ICD-10-CM | POA: Diagnosis not present

## 2021-10-19 DIAGNOSIS — I110 Hypertensive heart disease with heart failure: Secondary | ICD-10-CM | POA: Diagnosis not present

## 2021-10-19 DIAGNOSIS — Z6832 Body mass index (BMI) 32.0-32.9, adult: Secondary | ICD-10-CM | POA: Diagnosis not present

## 2021-10-19 DIAGNOSIS — I509 Heart failure, unspecified: Secondary | ICD-10-CM | POA: Diagnosis not present

## 2021-10-19 DIAGNOSIS — Z7982 Long term (current) use of aspirin: Secondary | ICD-10-CM | POA: Diagnosis not present

## 2021-10-19 DIAGNOSIS — I25119 Atherosclerotic heart disease of native coronary artery with unspecified angina pectoris: Secondary | ICD-10-CM | POA: Diagnosis not present

## 2021-10-19 DIAGNOSIS — Z7902 Long term (current) use of antithrombotics/antiplatelets: Secondary | ICD-10-CM | POA: Diagnosis not present

## 2021-11-02 ENCOUNTER — Other Ambulatory Visit: Payer: Self-pay | Admitting: Nurse Practitioner

## 2021-11-02 DIAGNOSIS — Z86010 Personal history of colonic polyps: Secondary | ICD-10-CM | POA: Diagnosis not present

## 2021-11-02 DIAGNOSIS — R7989 Other specified abnormal findings of blood chemistry: Secondary | ICD-10-CM | POA: Diagnosis not present

## 2021-11-02 DIAGNOSIS — F1090 Alcohol use, unspecified, uncomplicated: Secondary | ICD-10-CM | POA: Diagnosis not present

## 2021-11-02 DIAGNOSIS — D649 Anemia, unspecified: Secondary | ICD-10-CM | POA: Diagnosis not present

## 2021-11-02 DIAGNOSIS — R160 Hepatomegaly, not elsewhere classified: Secondary | ICD-10-CM

## 2021-11-05 ENCOUNTER — Other Ambulatory Visit (INDEPENDENT_AMBULATORY_CARE_PROVIDER_SITE_OTHER): Payer: Self-pay | Admitting: Nurse Practitioner

## 2021-11-10 ENCOUNTER — Other Ambulatory Visit (INDEPENDENT_AMBULATORY_CARE_PROVIDER_SITE_OTHER): Payer: 59

## 2021-11-10 ENCOUNTER — Ambulatory Visit (INDEPENDENT_AMBULATORY_CARE_PROVIDER_SITE_OTHER): Payer: 59 | Admitting: Nurse Practitioner

## 2021-11-10 ENCOUNTER — Ambulatory Visit
Admission: RE | Admit: 2021-11-10 | Discharge: 2021-11-10 | Disposition: A | Discharge status: Home / Self Care | Payer: 59 | Source: Ambulatory Visit | Attending: Nurse Practitioner | Admitting: Nurse Practitioner

## 2021-11-10 ENCOUNTER — Encounter (INDEPENDENT_AMBULATORY_CARE_PROVIDER_SITE_OTHER): Payer: 59

## 2021-11-10 DIAGNOSIS — K760 Fatty (change of) liver, not elsewhere classified: Secondary | ICD-10-CM | POA: Diagnosis not present

## 2021-11-10 DIAGNOSIS — R160 Hepatomegaly, not elsewhere classified: Secondary | ICD-10-CM | POA: Insufficient documentation

## 2021-11-10 DIAGNOSIS — K8020 Calculus of gallbladder without cholecystitis without obstruction: Secondary | ICD-10-CM | POA: Diagnosis not present

## 2021-11-10 DIAGNOSIS — F1090 Alcohol use, unspecified, uncomplicated: Secondary | ICD-10-CM | POA: Insufficient documentation

## 2021-11-17 ENCOUNTER — Other Ambulatory Visit (INDEPENDENT_AMBULATORY_CARE_PROVIDER_SITE_OTHER): Payer: Self-pay | Admitting: Vascular Surgery

## 2021-11-17 ENCOUNTER — Ambulatory Visit (INDEPENDENT_AMBULATORY_CARE_PROVIDER_SITE_OTHER): Payer: 59 | Admitting: Nurse Practitioner

## 2021-11-17 DIAGNOSIS — E782 Mixed hyperlipidemia: Secondary | ICD-10-CM | POA: Diagnosis not present

## 2021-11-17 DIAGNOSIS — I350 Nonrheumatic aortic (valve) stenosis: Secondary | ICD-10-CM | POA: Diagnosis not present

## 2021-11-17 DIAGNOSIS — Z23 Encounter for immunization: Secondary | ICD-10-CM | POA: Diagnosis not present

## 2021-11-17 DIAGNOSIS — N1831 Chronic kidney disease, stage 3a: Secondary | ICD-10-CM | POA: Diagnosis not present

## 2021-11-17 DIAGNOSIS — I701 Atherosclerosis of renal artery: Secondary | ICD-10-CM | POA: Diagnosis not present

## 2021-11-17 DIAGNOSIS — I10 Essential (primary) hypertension: Secondary | ICD-10-CM | POA: Diagnosis not present

## 2021-11-17 DIAGNOSIS — E118 Type 2 diabetes mellitus with unspecified complications: Secondary | ICD-10-CM | POA: Diagnosis not present

## 2021-11-17 DIAGNOSIS — I2510 Atherosclerotic heart disease of native coronary artery without angina pectoris: Secondary | ICD-10-CM | POA: Diagnosis not present

## 2021-11-17 DIAGNOSIS — I739 Peripheral vascular disease, unspecified: Secondary | ICD-10-CM

## 2021-11-17 DIAGNOSIS — Z951 Presence of aortocoronary bypass graft: Secondary | ICD-10-CM | POA: Diagnosis not present

## 2021-11-17 DIAGNOSIS — I255 Ischemic cardiomyopathy: Secondary | ICD-10-CM | POA: Diagnosis not present

## 2021-11-18 ENCOUNTER — Ambulatory Visit (INDEPENDENT_AMBULATORY_CARE_PROVIDER_SITE_OTHER): Payer: 59

## 2021-11-18 ENCOUNTER — Encounter (INDEPENDENT_AMBULATORY_CARE_PROVIDER_SITE_OTHER): Payer: Self-pay | Admitting: Nurse Practitioner

## 2021-11-18 ENCOUNTER — Ambulatory Visit (INDEPENDENT_AMBULATORY_CARE_PROVIDER_SITE_OTHER): Payer: 59 | Admitting: Nurse Practitioner

## 2021-11-18 VITALS — BP 142/65 | HR 52 | Resp 16 | Wt 234.0 lb

## 2021-11-18 DIAGNOSIS — E1122 Type 2 diabetes mellitus with diabetic chronic kidney disease: Secondary | ICD-10-CM

## 2021-11-18 DIAGNOSIS — I10 Essential (primary) hypertension: Secondary | ICD-10-CM

## 2021-11-18 DIAGNOSIS — N183 Chronic kidney disease, stage 3 unspecified: Secondary | ICD-10-CM

## 2021-11-18 DIAGNOSIS — Z9889 Other specified postprocedural states: Secondary | ICD-10-CM

## 2021-11-18 DIAGNOSIS — I6523 Occlusion and stenosis of bilateral carotid arteries: Secondary | ICD-10-CM

## 2021-11-18 DIAGNOSIS — I739 Peripheral vascular disease, unspecified: Secondary | ICD-10-CM | POA: Diagnosis not present

## 2021-11-21 ENCOUNTER — Encounter (INDEPENDENT_AMBULATORY_CARE_PROVIDER_SITE_OTHER): Payer: Self-pay

## 2021-11-22 ENCOUNTER — Encounter (INDEPENDENT_AMBULATORY_CARE_PROVIDER_SITE_OTHER): Payer: Self-pay | Admitting: Nurse Practitioner

## 2021-11-22 NOTE — Progress Notes (Signed)
Subjective:    Patient ID: Charles Ibarra, male    DOB: 1965-03-03, 56 y.o.   MRN: [id number] Chief Complaint  Patient presents with   Follow-up    Ultrasound follow up    The patient returns to the office for followup and review of the noninvasive studies.   There have been no interval changes in lower extremity symptoms. No interval shortening of the patient's claudication distance or development of rest pain symptoms. No new ulcers or wounds have occurred since the last visit.  There have been no significant changes to the patient's overall health care.  The patient denies amaurosis fugax or recent TIA symptoms. There are no documented recent neurological changes noted. There is no history of DVT, PE or superficial thrombophlebitis. The patient denies recent episodes of angina or shortness of breath.   ABI Rt=0.98 and Lt=0.67  (previous ABI's Rt=1.07 and Lt=0.99) Duplex ultrasound of the tibial vessels showed no primarily biphasic waveforms throughout with biphasic waveforms in the left lower extremity until the level of the mid SFA where there is noted 50 to 74% stenosis.  At that segment the patient transitions to monophasic waveforms.    Review of Systems  Cardiovascular:        Claudication  All other systems reviewed and are negative.      Objective:   Physical Exam Vitals reviewed.  HENT:     Head: Normocephalic.  Cardiovascular:     Rate and Rhythm: Normal rate.     Pulses:          Dorsalis pedis pulses are detected w/ Doppler on the right side and detected w/ Doppler on the left side.       Posterior tibial pulses are detected w/ Doppler on the right side and detected w/ Doppler on the left side.  Pulmonary:     Effort: Pulmonary effort is normal.  Skin:    General: Skin is warm and dry.  Neurological:     Mental Status: He is alert and oriented to person, place, and time.  Psychiatric:        Mood and Affect: Mood normal.        Behavior: Behavior  normal.        Thought Content: Thought content normal.        Judgment: Judgment normal.     BP (!) 142/65 (BP Location: Right Arm)   Pulse (!) 52   Resp 16   Wt 234 lb (106.1 kg)   BMI 31.74 kg/m   Past Medical History:  Diagnosis Date   Alcohol abuse    Aortic stenosis    Atherosclerotic PVD with intermittent claudication (HCC)    Chronic kidney disease, stage 3 (moderate)    Coronary artery disease involving native coronary artery of native heart with angina pectoris (HCC)    HTN (hypertension)    Hyperlipidemia    Ischemic cardiomyopathy    S/P CABG (coronary artery bypass graft)     Social History   Socioeconomic History   Marital status: Divorced    Spouse name: Not on file   Number of children: 1   Years of education: Not on file   Highest education level: Not on file  Occupational History   Not on file  Tobacco Use   Smoking status: Former    Types: Cigarettes    Quit date: 04/02/2010    Years since quitting: 11.6   Smokeless tobacco: Never  Substance and Sexual Activity   Alcohol use:  Not Currently    Alcohol/week: 1.0 standard drink of alcohol    Types: 1 Glasses of wine per week    Comment: 6 cand beer/week   Drug use: Not Currently   Sexual activity: Not Currently  Other Topics Concern   Not on file  Social History Narrative   Daughter , who lives in [place], [place]   Social Determinants of Health   Financial Resource Strain: Not on file  Food Insecurity: Not on file  Transportation Needs: Not on file  Physical Activity: Not on file  Stress: Not on file  Social Connections: Not on file  Intimate Partner Violence: Not on file    Past Surgical History:  Procedure Laterality Date   BACK SURGERY     CAROTID PTA/STENT INTERVENTION Left 02/10/2021   Procedure: CAROTID PTA/STENT INTERVENTION;  Surgeon: Algernon Huxley, MD;  Location: [facility];  Service: Cardiovascular;  Laterality: Left;   COLONOSCOPY     CORONARY ARTERY BYPASS GRAFT      LOWER EXTREMITY ANGIOGRAPHY Right 07/11/2021   Procedure: Lower Extremity Angiography;  Surgeon: Algernon Huxley, MD;  Location: [facility];  Service: Cardiovascular;  Laterality: Right;   NASAL FRACTURE SURGERY     TONSILLECTOMY     WISDOM TOOTH EXTRACTION      History reviewed. No pertinent family history.  No Known Allergies     Latest Ref Rng & Units 02/11/2021    5:22 AM  CBC  WBC 4.0 - 10.5 K/uL 5.7   Hemoglobin 13.0 - 17.0 g/dL 10.6   Hematocrit 39.0 - 52.0 % 31.7   Platelets 150 - 400 K/uL 118       CMP     Component Value Date/Time   NA 136 02/11/2021 0522   K 3.7 02/11/2021 0522   CL 103 02/11/2021 0522   CO2 26 02/11/2021 0522   GLUCOSE 135 (H) 02/11/2021 0522   BUN 23 (H) 07/11/2021 0804   CREATININE 1.20 07/11/2021 0804   CALCIUM 9.1 02/11/2021 0522   GFRNONAA >60 07/11/2021 0804     VAS [place] ABI WITH/WO TBI  Result Date: 11/21/2021  LOWER EXTREMITY DOPPLER STUDY Patient Name:  Charles Ibarra  Date of Exam:   11/18/2021 Medical Rec #: [id number]         Accession #:    [id number] Date of Birth: [birth year]        Patient Gender: M Patient Age:   41 years Exam Location:  [facility] Procedure:      VAS [place] ABI WITH/WO TBI Referring Phys: Leotis Pain --------------------------------------------------------------------------------  Indications: Claudication. High Risk Factors: Hypertension, hyperlipidemia, Diabetes, past history of                    smoking, coronary artery disease. Other Factors: Patient noticed one month of left calf claudication.  Vascular Interventions: 07/11/2021: Aortogram and Selective Right Lower                         Extremity Angiogram. PTA of the Right SFA and above knee                         Popliteal Artery with 5 mm diameter Lutonix drug coated                         angioplasty balloon distally. Viabahn Stent placement x2  with a pair of 6 mm diameter by 25 cm length stents in                          the Right SFA and Popliteal Artery. Performing Technologist: Delorise Shiner RVT  Examination Guidelines: A complete evaluation includes at minimum, Doppler waveform signals and systolic blood pressure reading at the level of bilateral brachial, anterior tibial, and posterior tibial arteries, when vessel segments are accessible. Bilateral testing is considered an integral part of a complete examination. Photoelectric Plethysmograph (PPG) waveforms and toe systolic pressure readings are included as required and additional duplex testing as needed. Limited examinations for reoccurring indications may be performed as noted.  ABI Findings: +---------+------------------+-----+---------+--------+ Right    Rt Pressure (mmHg)IndexWaveform Comment  +---------+------------------+-----+---------+--------+ Brachial 169                                      +---------+------------------+-----+---------+--------+ PTA      161               0.95 triphasic         +---------+------------------+-----+---------+--------+ DP       165               0.98 biphasic          +---------+------------------+-----+---------+--------+ Great Toe130               0.77                   +---------+------------------+-----+---------+--------+ +---------+------------------+-----+----------+-------+ Left     Lt Pressure (mmHg)IndexWaveform  Comment +---------+------------------+-----+----------+-------+ Brachial 164                                      +---------+------------------+-----+----------+-------+ PTA      107               0.63 monophasic        +---------+------------------+-----+----------+-------+ DP       113               0.67 biphasic          +---------+------------------+-----+----------+-------+ Great Toe78                0.46                   +---------+------------------+-----+----------+-------+ +-------+-----------+-----------+------------+------------+  ABI/TBIToday's ABIToday's TBIPrevious ABIPrevious TBI +-------+-----------+-----------+------------+------------+ Right  0.98       0.77       1.07        0.55         +-------+-----------+-----------+------------+------------+ Left   0.67       0.46       0.97        1.01         +-------+-----------+-----------+------------+------------+ Right ABIs appear essentially unchanged compared to prior study on 08/10/2021. Left ABIs appear decreased compared to prior study on 08/10/2021.  Summary: Right: Resting right ankle-brachial index is within normal range. The right toe-brachial index is normal. Left: Resting left ankle-brachial index indicates moderate left lower extremity arterial disease. The left toe-brachial index is abnormal. *See table(s) above for measurements and observations.  Electronically signed by Leotis Pain MD on 11/21/2021 at 11:48:08 AM.    Final        Assessment & Plan:  1. Peripheral arterial disease with history of revascularization (HCC)  Recommend:  The patient has evidence of atherosclerosis of the lower extremities with claudication.  The patient does not voice lifestyle limiting changes at this point in time.  Noninvasive studies do not suggest clinically significant change.  No invasive studies, angiography or surgery at this time The patient should continue walking and begin a more formal exercise program.  The patient should continue antiplatelet therapy and aggressive treatment of the lipid abnormalities  No changes in the patient's medications at this time  Continued surveillance is indicated as atherosclerosis is likely to progress with time.    The patient will continue follow up with noninvasive studies as ordered.    2. Bilateral carotid artery stenosis Currently no signs symptoms of worsening carotid disease.  Patient's had a previous carotid stent placed.  We will evaluate at his upcoming follow-up in 6 months.  3. Essential  hypertension Continue antihypertensive medications as already ordered, these medications have been reviewed and there are no changes at this time.   4. Type 2 diabetes mellitus with stage 3 chronic kidney disease, without long-term current use of insulin, unspecified whether stage 3a or 3b CKD ([place]) Continue hypoglycemic medications as already ordered, these medications have been reviewed and there are no changes at this time.  Hgb A1C to be monitored as already arranged by primary service    Current Outpatient Medications on File Prior to Visit  Medication Sig Dispense Refill   aspirin EC 81 MG tablet Take 81 mg by mouth daily.     atorvastatin (LIPITOR) 80 MG tablet Take 80 mg by mouth at bedtime.     carvedilol (COREG) 12.5 MG tablet Take 12.5 mg by mouth 2 (two) times daily with a meal.     clopidogrel (PLAVIX) 75 MG tablet Take 1 tablet by mouth once daily 90 tablet 0   EPINEPHrine 0.3 mg/0.3 mL IJ SOAJ injection      levocetirizine (XYZAL) 5 MG tablet Take 5 mg by mouth every evening.     losartan (COZAAR) 100 MG tablet Take 100 mg by mouth daily.     nitroGLYCERIN (NITROSTAT) 0.4 MG SL tablet Place under the tongue.     torsemide (DEMADEX) 20 MG tablet Take 20 mg by mouth daily.     No current facility-administered medications on file prior to visit.    There are no Patient Instructions on file for this visit. No follow-ups on file.   Kris Hartmann, NP

## 2021-12-23 ENCOUNTER — Encounter (INDEPENDENT_AMBULATORY_CARE_PROVIDER_SITE_OTHER): Payer: Self-pay

## 2021-12-23 ENCOUNTER — Ambulatory Visit (INDEPENDENT_AMBULATORY_CARE_PROVIDER_SITE_OTHER): Payer: 59 | Admitting: Vascular Surgery

## 2021-12-23 ENCOUNTER — Ambulatory Visit (INDEPENDENT_AMBULATORY_CARE_PROVIDER_SITE_OTHER): Payer: 59 | Admitting: Nurse Practitioner

## 2021-12-23 ENCOUNTER — Encounter (INDEPENDENT_AMBULATORY_CARE_PROVIDER_SITE_OTHER): Payer: Self-pay | Admitting: Nurse Practitioner

## 2021-12-23 ENCOUNTER — Ambulatory Visit (INDEPENDENT_AMBULATORY_CARE_PROVIDER_SITE_OTHER): Payer: 59

## 2021-12-23 DIAGNOSIS — I6521 Occlusion and stenosis of right carotid artery: Secondary | ICD-10-CM

## 2021-12-23 DIAGNOSIS — I10 Essential (primary) hypertension: Secondary | ICD-10-CM

## 2021-12-23 DIAGNOSIS — N183 Chronic kidney disease, stage 3 unspecified: Secondary | ICD-10-CM

## 2021-12-23 DIAGNOSIS — E1122 Type 2 diabetes mellitus with diabetic chronic kidney disease: Secondary | ICD-10-CM | POA: Diagnosis not present

## 2021-12-23 DIAGNOSIS — I6522 Occlusion and stenosis of left carotid artery: Secondary | ICD-10-CM | POA: Diagnosis not present

## 2021-12-23 NOTE — H&P (View-Only) (Signed)
Subjective:    Patient ID: Rigoberto Noel, male    DOB: [birth year], 56 y.o.   MRN: [id number] No chief complaint on file.   Brockton Mckesson is a 56 year old male who presents today for follow-up of his carotid stenosis.  The patient previously had a severe stenosis of the left ICA which was treated with carotid stent on 01/31/2021.  Today that left carotid stent is patent no area of significant restenosis.  The patient has had a history of a retinal artery occlusion caused by the severe stenosis in his left ICA.  Since that time the patient has done well.  Today he has no amaurosis fugax or signs symptoms of TIA/stroke.  However, carotid duplex today reveals a 40 to 59% right ICA stenosis however the patient has an irregular plaque but it is notable that he currently has a mobile plaque noted within the distal common carotid artery.    Review of Systems  Neurological:  Negative for facial asymmetry, speech difficulty and numbness.  All other systems reviewed and are negative.      Objective:   Physical Exam Vitals reviewed.  HENT:     Head: Normocephalic.  Cardiovascular:     Rate and Rhythm: Normal rate.     Pulses: Normal pulses.  Pulmonary:     Effort: Pulmonary effort is normal.  Skin:    General: Skin is warm and dry.  Neurological:     Mental Status: He is alert and oriented to person, place, and time.  Psychiatric:        Mood and Affect: Mood normal.        Behavior: Behavior normal.        Thought Content: Thought content normal.        Judgment: Judgment normal.     There were no vitals taken for this visit.  Past Medical History:  Diagnosis Date   Alcohol abuse    Aortic stenosis    Atherosclerotic PVD with intermittent claudication (HCC)    Chronic kidney disease, stage 3 (moderate)    Coronary artery disease involving native coronary artery of native heart with angina pectoris (HCC)    HTN (hypertension)    Hyperlipidemia    Ischemic cardiomyopathy     S/P CABG (coronary artery bypass graft)     Social History   Socioeconomic History   Marital status: Divorced    Spouse name: Not on file   Number of children: 1   Years of education: Not on file   Highest education level: Not on file  Occupational History   Not on file  Tobacco Use   Smoking status: Former    Types: Cigarettes    Quit date: 04/02/2010    Years since quitting: 11.7   Smokeless tobacco: Never  Substance and Sexual Activity   Alcohol use: Not Currently    Alcohol/week: 1.0 standard drink of alcohol    Types: 1 Glasses of wine per week    Comment: 6 cand beer/week   Drug use: Not Currently   Sexual activity: Not Currently  Other Topics Concern   Not on file  Social History Narrative   Daughter , who lives in [place], [place]   Social Determinants of Health   Financial Resource Strain: Not on file  Food Insecurity: Not on file  Transportation Needs: Not on file  Physical Activity: Not on file  Stress: Not on file  Social Connections: Not on file  Intimate Partner Violence: Not on file  Past Surgical History:  Procedure Laterality Date   BACK SURGERY     CAROTID PTA/STENT INTERVENTION Left 02/10/2021   Procedure: CAROTID PTA/STENT INTERVENTION;  Surgeon: Algernon Huxley, MD;  Location: [facility];  Service: Cardiovascular;  Laterality: Left;   COLONOSCOPY     CORONARY ARTERY BYPASS GRAFT     LOWER EXTREMITY ANGIOGRAPHY Right 07/11/2021   Procedure: Lower Extremity Angiography;  Surgeon: Algernon Huxley, MD;  Location: [facility];  Service: Cardiovascular;  Laterality: Right;   NASAL FRACTURE SURGERY     TONSILLECTOMY     WISDOM TOOTH EXTRACTION      No family history on file.  No Known Allergies     Latest Ref Rng & Units 02/11/2021    5:22 AM  CBC  WBC 4.0 - 10.5 K/uL 5.7   Hemoglobin 13.0 - 17.0 g/dL 10.6   Hematocrit 39.0 - 52.0 % 31.7   Platelets 150 - 400 K/uL 118       CMP     Component Value Date/Time   NA 136  02/11/2021 0522   K 3.7 02/11/2021 0522   CL 103 02/11/2021 0522   CO2 26 02/11/2021 0522   GLUCOSE 135 (H) 02/11/2021 0522   BUN 23 (H) 07/11/2021 0804   CREATININE 1.20 07/11/2021 0804   CALCIUM 9.1 02/11/2021 0522   GFRNONAA >60 07/11/2021 0804     VAS [place] ABI WITH/WO TBI  Result Date: 11/21/2021  LOWER EXTREMITY DOPPLER STUDY Patient Name:  REI MEDLEN  Date of Exam:   11/18/2021 Medical Rec #: [id number]         Accession #:    [id number] Date of Birth: [birth year]        Patient Gender: M Patient Age:   33 years Exam Location:  [facility] Procedure:      VAS [place] ABI WITH/WO TBI Referring Phys: Leotis Pain --------------------------------------------------------------------------------  Indications: Claudication. High Risk Factors: Hypertension, hyperlipidemia, Diabetes, past history of                    smoking, coronary artery disease. Other Factors: Patient noticed one month of left calf claudication.  Vascular Interventions: 07/11/2021: Aortogram and Selective Right Lower                         Extremity Angiogram. PTA of the Right SFA and above knee                         Popliteal Artery with 5 mm diameter Lutonix drug coated                         angioplasty balloon distally. Viabahn Stent placement x2                         with a pair of 6 mm diameter by 25 cm length stents in                         the Right SFA and Popliteal Artery. Performing Technologist: Delorise Shiner RVT  Examination Guidelines: A complete evaluation includes at minimum, Doppler waveform signals and systolic blood pressure reading at the level of bilateral brachial, anterior tibial, and posterior tibial arteries, when vessel segments are accessible. Bilateral testing is considered an integral part of a complete examination. Photoelectric  Plethysmograph (PPG) waveforms and toe systolic pressure readings are included as required and additional duplex testing as needed. Limited examinations for  reoccurring indications may be performed as noted.  ABI Findings: +---------+------------------+-----+---------+--------+ Right    Rt Pressure (mmHg)IndexWaveform Comment  +---------+------------------+-----+---------+--------+ Brachial 169                                      +---------+------------------+-----+---------+--------+ PTA      161               0.95 triphasic         +---------+------------------+-----+---------+--------+ DP       165               0.98 biphasic          +---------+------------------+-----+---------+--------+ Great Toe130               0.77                   +---------+------------------+-----+---------+--------+ +---------+------------------+-----+----------+-------+ Left     Lt Pressure (mmHg)IndexWaveform  Comment +---------+------------------+-----+----------+-------+ Brachial 164                                      +---------+------------------+-----+----------+-------+ PTA      107               0.63 monophasic        +---------+------------------+-----+----------+-------+ DP       113               0.67 biphasic          +---------+------------------+-----+----------+-------+ Great Toe78                0.46                   +---------+------------------+-----+----------+-------+ +-------+-----------+-----------+------------+------------+ ABI/TBIToday's ABIToday's TBIPrevious ABIPrevious TBI +-------+-----------+-----------+------------+------------+ Right  0.98       0.77       1.07        0.55         +-------+-----------+-----------+------------+------------+ Left   0.67       0.46       0.97        1.01         +-------+-----------+-----------+------------+------------+ Right ABIs appear essentially unchanged compared to prior study on 08/10/2021. Left ABIs appear decreased compared to prior study on 08/10/2021.  Summary: Right: Resting right ankle-brachial index is within normal range. The right  toe-brachial index is normal. Left: Resting left ankle-brachial index indicates moderate left lower extremity arterial disease. The left toe-brachial index is abnormal. *See table(s) above for measurements and observations.  Electronically signed by Leotis Pain MD on 11/21/2021 at 11:48:08 AM.    Final        Assessment & Plan:   1. Stenosis of right carotid artery Given the concerning mobile plaque in the common carotid artery the patient should have an urgent carotid stent placed.  Patient is advised to remain on Plavix and aspirin.   The anatomical considerations support stenting over surgery.  This was discussed in detail with the patient.  The risks, benefits and alternative therapies were reviewed in detail with the patient.  All questions were answered.  The patient agrees to proceed with stenting of the right carotid artery.  Continue antiplatelet therapy as prescribed. Continue management  of CAD, HTN and Hyperlipidemia. Healthy heart diet, encouraged exercise at least 4 times per week.  Case discussed with Dr. Lucky Cowboy and recommended urgent carotid stent placement next week.  2. Essential hypertension Continue antihypertensive medications as already ordered, these medications have been reviewed and there are no changes at this time.  3. Type 2 diabetes mellitus with stage 3 chronic kidney disease, without long-term current use of insulin, unspecified whether stage 3a or 3b CKD ([place]) Continue hypoglycemic medications as already ordered, these medications have been reviewed and there are no changes at this time.  Hgb A1C to be monitored as already arranged by primary service   Current Outpatient Medications on File Prior to Visit  Medication Sig Dispense Refill   aspirin EC 81 MG tablet Take 81 mg by mouth daily.     atorvastatin (LIPITOR) 80 MG tablet Take 80 mg by mouth at bedtime.     carvedilol (COREG) 12.5 MG tablet Take 12.5 mg by mouth 2 (two) times daily with a meal.      clopidogrel (PLAVIX) 75 MG tablet Take 1 tablet by mouth once daily 90 tablet 0   EPINEPHrine 0.3 mg/0.3 mL IJ SOAJ injection      levocetirizine (XYZAL) 5 MG tablet Take 5 mg by mouth every evening.     losartan (COZAAR) 100 MG tablet Take 100 mg by mouth daily.     nitroGLYCERIN (NITROSTAT) 0.4 MG SL tablet Place under the tongue.     torsemide (DEMADEX) 20 MG tablet Take 20 mg by mouth daily.     No current facility-administered medications on file prior to visit.    There are no Patient Instructions on file for this visit. No follow-ups on file.   Kris Hartmann, NP

## 2021-12-23 NOTE — Progress Notes (Signed)
Subjective:    Patient ID: Charles Ibarra, male    DOB: Sep 24, 1965, 56 y.o.   MRN: [id number] No chief complaint on file.   Charles Ibarra is a 57 year old male who presents today for follow-up of his carotid stenosis.  The patient previously had a severe stenosis of the left ICA which was treated with carotid stent on 01/31/2021.  Today that left carotid stent is patent no area of significant restenosis.  The patient has had a history of a retinal artery occlusion caused by the severe stenosis in his left ICA.  Since that time the patient has done well.  Today he has no amaurosis fugax or signs symptoms of TIA/stroke.  However, carotid duplex today reveals a 40 to 59% right ICA stenosis however the patient has an irregular plaque but it is notable that he currently has a mobile plaque noted within the distal common carotid artery.    Review of Systems  Neurological:  Negative for facial asymmetry, speech difficulty and numbness.  All other systems reviewed and are negative.      Objective:   Physical Exam Vitals reviewed.  HENT:     Head: Normocephalic.  Cardiovascular:     Rate and Rhythm: Normal rate.     Pulses: Normal pulses.  Pulmonary:     Effort: Pulmonary effort is normal.  Skin:    General: Skin is warm and dry.  Neurological:     Mental Status: He is alert and oriented to person, place, and time.  Psychiatric:        Mood and Affect: Mood normal.        Behavior: Behavior normal.        Thought Content: Thought content normal.        Judgment: Judgment normal.     There were no vitals taken for this visit.  Past Medical History:  Diagnosis Date   Alcohol abuse    Aortic stenosis    Atherosclerotic PVD with intermittent claudication (HCC)    Chronic kidney disease, stage 3 (moderate)    Coronary artery disease involving native coronary artery of native heart with angina pectoris (HCC)    HTN (hypertension)    Hyperlipidemia    Ischemic cardiomyopathy     S/P CABG (coronary artery bypass graft)     Social History   Socioeconomic History   Marital status: Divorced    Spouse name: Not on file   Number of children: 1   Years of education: Not on file   Highest education level: Not on file  Occupational History   Not on file  Tobacco Use   Smoking status: Former    Types: Cigarettes    Quit date: 04/02/2010    Years since quitting: 11.7   Smokeless tobacco: Never  Substance and Sexual Activity   Alcohol use: Not Currently    Alcohol/week: 1.0 standard drink of alcohol    Types: 1 Glasses of wine per week    Comment: 6 cand beer/week   Drug use: Not Currently   Sexual activity: Not Currently  Other Topics Concern   Not on file  Social History Narrative   Daughter , who lives in [place], [place]   Social Determinants of Health   Financial Resource Strain: Not on file  Food Insecurity: Not on file  Transportation Needs: Not on file  Physical Activity: Not on file  Stress: Not on file  Social Connections: Not on file  Intimate Partner Violence: Not on file  Past Surgical History:  Procedure Laterality Date   BACK SURGERY     CAROTID PTA/STENT INTERVENTION Left 02/10/2021   Procedure: CAROTID PTA/STENT INTERVENTION;  Surgeon: Algernon Huxley, MD;  Location: [facility];  Service: Cardiovascular;  Laterality: Left;   COLONOSCOPY     CORONARY ARTERY BYPASS GRAFT     LOWER EXTREMITY ANGIOGRAPHY Right 07/11/2021   Procedure: Lower Extremity Angiography;  Surgeon: Algernon Huxley, MD;  Location: [facility];  Service: Cardiovascular;  Laterality: Right;   NASAL FRACTURE SURGERY     TONSILLECTOMY     WISDOM TOOTH EXTRACTION      No family history on file.  No Known Allergies     Latest Ref Rng & Units 02/11/2021    5:22 AM  CBC  WBC 4.0 - 10.5 K/uL 5.7   Hemoglobin 13.0 - 17.0 g/dL 10.6   Hematocrit 39.0 - 52.0 % 31.7   Platelets 150 - 400 K/uL 118       CMP     Component Value Date/Time   NA 136  02/11/2021 0522   K 3.7 02/11/2021 0522   CL 103 02/11/2021 0522   CO2 26 02/11/2021 0522   GLUCOSE 135 (H) 02/11/2021 0522   BUN 23 (H) 07/11/2021 0804   CREATININE 1.20 07/11/2021 0804   CALCIUM 9.1 02/11/2021 0522   GFRNONAA >60 07/11/2021 0804     VAS [place] ABI WITH/WO TBI  Result Date: 11/21/2021  LOWER EXTREMITY DOPPLER STUDY Patient Name:  Charles Ibarra  Date of Exam:   11/18/2021 Medical Rec #: [id number]         Accession #:    [id number] Date of Birth: [birth year]        Patient Gender: M Patient Age:   50 years Exam Location:  [facility] Procedure:      VAS [place] ABI WITH/WO TBI Referring Phys: Leotis Pain --------------------------------------------------------------------------------  Indications: Claudication. High Risk Factors: Hypertension, hyperlipidemia, Diabetes, past history of                    smoking, coronary artery disease. Other Factors: Patient noticed one month of left calf claudication.  Vascular Interventions: 07/11/2021: Aortogram and Selective Right Lower                         Extremity Angiogram. PTA of the Right SFA and above knee                         Popliteal Artery with 5 mm diameter Lutonix drug coated                         angioplasty balloon distally. Viabahn Stent placement x2                         with a pair of 6 mm diameter by 25 cm length stents in                         the Right SFA and Popliteal Artery. Performing Technologist: Delorise Shiner RVT  Examination Guidelines: A complete evaluation includes at minimum, Doppler waveform signals and systolic blood pressure reading at the level of bilateral brachial, anterior tibial, and posterior tibial arteries, when vessel segments are accessible. Bilateral testing is considered an integral part of a complete examination. Photoelectric  Plethysmograph (PPG) waveforms and toe systolic pressure readings are included as required and additional duplex testing as needed. Limited examinations for  reoccurring indications may be performed as noted.  ABI Findings: +---------+------------------+-----+---------+--------+ Right    Rt Pressure (mmHg)IndexWaveform Comment  +---------+------------------+-----+---------+--------+ Brachial 169                                      +---------+------------------+-----+---------+--------+ PTA      161               0.95 triphasic         +---------+------------------+-----+---------+--------+ DP       165               0.98 biphasic          +---------+------------------+-----+---------+--------+ Great Toe130               0.77                   +---------+------------------+-----+---------+--------+ +---------+------------------+-----+----------+-------+ Left     Lt Pressure (mmHg)IndexWaveform  Comment +---------+------------------+-----+----------+-------+ Brachial 164                                      +---------+------------------+-----+----------+-------+ PTA      107               0.63 monophasic        +---------+------------------+-----+----------+-------+ DP       113               0.67 biphasic          +---------+------------------+-----+----------+-------+ Great Toe78                0.46                   +---------+------------------+-----+----------+-------+ +-------+-----------+-----------+------------+------------+ ABI/TBIToday's ABIToday's TBIPrevious ABIPrevious TBI +-------+-----------+-----------+------------+------------+ Right  0.98       0.77       1.07        0.55         +-------+-----------+-----------+------------+------------+ Left   0.67       0.46       0.97        1.01         +-------+-----------+-----------+------------+------------+ Right ABIs appear essentially unchanged compared to prior study on 08/10/2021. Left ABIs appear decreased compared to prior study on 08/10/2021.  Summary: Right: Resting right ankle-brachial index is within normal range. The right  toe-brachial index is normal. Left: Resting left ankle-brachial index indicates moderate left lower extremity arterial disease. The left toe-brachial index is abnormal. *See table(s) above for measurements and observations.  Electronically signed by Leotis Pain MD on 11/21/2021 at 11:48:08 AM.    Final        Assessment & Plan:   1. Stenosis of right carotid artery Given the concerning mobile plaque in the common carotid artery the patient should have an urgent carotid stent placed.  Patient is advised to remain on Plavix and aspirin.   The anatomical considerations support stenting over surgery.  This was discussed in detail with the patient.  The risks, benefits and alternative therapies were reviewed in detail with the patient.  All questions were answered.  The patient agrees to proceed with stenting of the right carotid artery.  Continue antiplatelet therapy as prescribed. Continue management  of CAD, HTN and Hyperlipidemia. Healthy heart diet, encouraged exercise at least 4 times per week.  Case discussed with Dr. Lucky Cowboy and recommended urgent carotid stent placement next week.  2. Essential hypertension Continue antihypertensive medications as already ordered, these medications have been reviewed and there are no changes at this time.  3. Type 2 diabetes mellitus with stage 3 chronic kidney disease, without long-term current use of insulin, unspecified whether stage 3a or 3b CKD ([place]) Continue hypoglycemic medications as already ordered, these medications have been reviewed and there are no changes at this time.  Hgb A1C to be monitored as already arranged by primary service   Current Outpatient Medications on File Prior to Visit  Medication Sig Dispense Refill   aspirin EC 81 MG tablet Take 81 mg by mouth daily.     atorvastatin (LIPITOR) 80 MG tablet Take 80 mg by mouth at bedtime.     carvedilol (COREG) 12.5 MG tablet Take 12.5 mg by mouth 2 (two) times daily with a meal.      clopidogrel (PLAVIX) 75 MG tablet Take 1 tablet by mouth once daily 90 tablet 0   EPINEPHrine 0.3 mg/0.3 mL IJ SOAJ injection      levocetirizine (XYZAL) 5 MG tablet Take 5 mg by mouth every evening.     losartan (COZAAR) 100 MG tablet Take 100 mg by mouth daily.     nitroGLYCERIN (NITROSTAT) 0.4 MG SL tablet Place under the tongue.     torsemide (DEMADEX) 20 MG tablet Take 20 mg by mouth daily.     No current facility-administered medications on file prior to visit.    There are no Patient Instructions on file for this visit. No follow-ups on file.   Kris Hartmann, NP

## 2021-12-23 NOTE — Progress Notes (Unsigned)
Error

## 2021-12-26 ENCOUNTER — Ambulatory Visit
Admission: RE | Admit: 2021-12-26 | Discharge: 2021-12-26 | Disposition: A | Discharge status: Home / Self Care | Payer: 59 | Attending: Vascular Surgery | Admitting: Vascular Surgery

## 2021-12-26 ENCOUNTER — Encounter
Admission: RE | Disposition: A | Discharge status: Home / Self Care | Payer: Self-pay | Source: Home / Self Care | Attending: Vascular Surgery

## 2021-12-26 ENCOUNTER — Other Ambulatory Visit: Payer: Self-pay

## 2021-12-26 ENCOUNTER — Encounter: Payer: Self-pay | Admitting: Vascular Surgery

## 2021-12-26 ENCOUNTER — Other Ambulatory Visit (INDEPENDENT_AMBULATORY_CARE_PROVIDER_SITE_OTHER): Payer: Self-pay | Admitting: Nurse Practitioner

## 2021-12-26 DIAGNOSIS — Z95828 Presence of other vascular implants and grafts: Secondary | ICD-10-CM | POA: Diagnosis not present

## 2021-12-26 DIAGNOSIS — Z7902 Long term (current) use of antithrombotics/antiplatelets: Secondary | ICD-10-CM | POA: Insufficient documentation

## 2021-12-26 DIAGNOSIS — I779 Disorder of arteries and arterioles, unspecified: Principal | ICD-10-CM | POA: Insufficient documentation

## 2021-12-26 DIAGNOSIS — Z87891 Personal history of nicotine dependence: Secondary | ICD-10-CM | POA: Diagnosis not present

## 2021-12-26 DIAGNOSIS — Z9889 Other specified postprocedural states: Secondary | ICD-10-CM

## 2021-12-26 DIAGNOSIS — E1122 Type 2 diabetes mellitus with diabetic chronic kidney disease: Secondary | ICD-10-CM | POA: Insufficient documentation

## 2021-12-26 DIAGNOSIS — I129 Hypertensive chronic kidney disease with stage 1 through stage 4 chronic kidney disease, or unspecified chronic kidney disease: Secondary | ICD-10-CM | POA: Diagnosis not present

## 2021-12-26 DIAGNOSIS — N183 Chronic kidney disease, stage 3 unspecified: Secondary | ICD-10-CM | POA: Diagnosis not present

## 2021-12-26 DIAGNOSIS — I6521 Occlusion and stenosis of right carotid artery: Secondary | ICD-10-CM

## 2021-12-26 DIAGNOSIS — I2510 Atherosclerotic heart disease of native coronary artery without angina pectoris: Secondary | ICD-10-CM | POA: Diagnosis not present

## 2021-12-26 DIAGNOSIS — Z7982 Long term (current) use of aspirin: Secondary | ICD-10-CM | POA: Insufficient documentation

## 2021-12-26 DIAGNOSIS — E785 Hyperlipidemia, unspecified: Secondary | ICD-10-CM | POA: Diagnosis not present

## 2021-12-26 DIAGNOSIS — Q2549 Other congenital malformations of aorta: Secondary | ICD-10-CM | POA: Diagnosis not present

## 2021-12-26 HISTORY — PX: CAROTID ANGIOGRAPHY: CATH118230

## 2021-12-26 LAB — BASIC METABOLIC PANEL
Anion gap: 11 (ref 5–15)
BUN: 27 mg/dL — ABNORMAL HIGH (ref 6–20)
CO2: 26 mmol/L (ref 22–32)
Calcium: 9.8 mg/dL (ref 8.9–10.3)
Chloride: 102 mmol/L (ref 98–111)
Creatinine, Ser: 1.31 mg/dL — ABNORMAL HIGH (ref 0.61–1.24)
GFR, Estimated: >60 mL/min (ref >60)
Glucose, Bld: 130 mg/dL — ABNORMAL HIGH (ref 70–99)
Potassium: 3.8 mmol/L (ref 3.5–5.1)
Sodium: 139 mmol/L (ref 135–145)

## 2021-12-26 SURGERY — CAROTID ANGIOGRAPHY
Anesthesia: Moderate Sedation | Laterality: Right

## 2021-12-26 MED ORDER — FENTANYL CITRATE PF 50 MCG/ML IJ SOSY
PREFILLED_SYRINGE | INTRAMUSCULAR | Status: AC
  Filled 2021-12-26: qty 1

## 2021-12-26 MED ORDER — SODIUM CHLORIDE 0.9 % IV SOLN: INTRAVENOUS | Status: DC

## 2021-12-26 MED ORDER — ATROPINE SULFATE 1 MG/10ML IJ SOSY
PREFILLED_SYRINGE | INTRAMUSCULAR | Status: AC
  Filled 2021-12-26: qty 10

## 2021-12-26 MED ORDER — HEPARIN SODIUM (PORCINE) 1000 UNIT/ML IJ SOLN
INTRAMUSCULAR | Status: AC
  Filled 2021-12-26: qty 10

## 2021-12-26 MED ORDER — MIDAZOLAM HCL 2 MG/2ML IJ SOLN
INTRAMUSCULAR | Status: AC
  Filled 2021-12-26: qty 2

## 2021-12-26 MED ORDER — FAMOTIDINE 20 MG PO TABS: 40.0000 mg | ORAL_TABLET | Freq: Once | ORAL | Status: DC | PRN

## 2021-12-26 MED ORDER — DOPAMINE-DEXTROSE 3.2-5 MG/ML-% IV SOLN
INTRAVENOUS | Status: AC
  Filled 2021-12-26: qty 250

## 2021-12-26 MED ORDER — MIDAZOLAM HCL 2 MG/2ML IJ SOLN
INTRAMUSCULAR | Status: DC | PRN
  Administered 2021-12-26: .5 mg via INTRAVENOUS
  Administered 2021-12-26: 2 mg via INTRAVENOUS

## 2021-12-26 MED ORDER — PHENYLEPHRINE HCL (PRESSORS) 10 MG/ML IV SOLN
INTRAVENOUS | Status: AC
  Filled 2021-12-26: qty 1

## 2021-12-26 MED ORDER — FENTANYL CITRATE (PF) 100 MCG/2ML IJ SOLN
INTRAMUSCULAR | Status: DC | PRN
  Administered 2021-12-26: 25 ug via INTRAVENOUS
  Administered 2021-12-26: 50 ug via INTRAVENOUS

## 2021-12-26 MED ORDER — METHYLPREDNISOLONE SODIUM SUCC 125 MG IJ SOLR: 125.0000 mg | Freq: Once | INTRAMUSCULAR | Status: DC | PRN

## 2021-12-26 MED ORDER — CEFAZOLIN SODIUM-DEXTROSE 2-4 GM/100ML-% IV SOLN
2.0000 g | INTRAVENOUS | Status: AC
  Administered 2021-12-26: 2 g via INTRAVENOUS

## 2021-12-26 MED ORDER — CEFAZOLIN SODIUM-DEXTROSE 2-4 GM/100ML-% IV SOLN
INTRAVENOUS | Status: AC
  Filled 2021-12-26: qty 100

## 2021-12-26 MED ORDER — DIPHENHYDRAMINE HCL 50 MG/ML IJ SOLN: 50.0000 mg | Freq: Once | INTRAMUSCULAR | Status: DC | PRN

## 2021-12-26 MED ORDER — HEPARIN SODIUM (PORCINE) 1000 UNIT/ML IJ SOLN
INTRAMUSCULAR | Status: DC | PRN
  Administered 2021-12-26: 8000 [IU] via INTRAVENOUS

## 2021-12-26 MED ORDER — MIDAZOLAM HCL 2 MG/ML PO SYRP: 8.0000 mg | ORAL_SOLUTION | Freq: Once | ORAL | Status: DC | PRN

## 2021-12-26 MED ORDER — HYDROMORPHONE HCL 1 MG/ML IJ SOLN: 1.0000 mg | Freq: Once | INTRAMUSCULAR | Status: DC | PRN

## 2021-12-26 MED ORDER — PHENYLEPHRINE HCL-NACL 20-0.9 MG/250ML-% IV SOLN
INTRAVENOUS | Status: AC
  Filled 2021-12-26: qty 250

## 2021-12-26 MED ORDER — ONDANSETRON HCL 4 MG/2ML IJ SOLN: 4.0000 mg | Freq: Four times a day (QID) | INTRAMUSCULAR | Status: DC | PRN

## 2021-12-26 MED ORDER — PHENYLEPHRINE 80 MCG/ML (10ML) SYRINGE FOR IV PUSH (FOR BLOOD PRESSURE SUPPORT)
PREFILLED_SYRINGE | INTRAVENOUS | Status: AC
  Filled 2021-12-26: qty 10

## 2021-12-26 SURGICAL SUPPLY — 11 items
CATH ANGIO 5F PIGTAIL 100CM (CATHETERS) IMPLANT
CATH HEADHUNTER H1 5F 100CM (CATHETERS) IMPLANT
DEVICE STARCLOSE SE CLOSURE (Vascular Products) IMPLANT
DEVICE TORQUE .025-.038 (MISCELLANEOUS) IMPLANT
DRAPE C-ARM 35X43 STRL (DRAPES) IMPLANT
GLIDEWIRE ANGLED SS 035X260CM (WIRE) IMPLANT
KIT CAROTID MANIFOLD (MISCELLANEOUS) IMPLANT
PACK ANGIOGRAPHY (CUSTOM PROCEDURE TRAY) ×1 IMPLANT
SHEATH BRITE TIP 5FRX11 (SHEATH) IMPLANT
SYR MEDRAD MARK 7 150ML (SYRINGE) IMPLANT
WIRE GUIDERIGHT .035X150 (WIRE) IMPLANT

## 2021-12-26 NOTE — Interval H&P Note (Signed)
History and Physical Interval Note:  12/26/2021 1:05 PM  Charles Ibarra  has presented today for surgery, with the diagnosis of Right carotid stent placement.  The various methods of treatment have been discussed with the patient and family. After consideration of risks, benefits and other options for treatment, the patient has consented to  Procedure(s): CAROTID ANGIOGRAPHY (Right) as a surgical intervention.  [name] history has been reviewed, patient examined, no change in status, stable for surgery.  I have reviewed the patient's chart and labs.  Questions were answered to the patient's satisfaction.     Leotis Pain

## 2021-12-26 NOTE — Discharge Instructions (Addendum)
Followup Appointment made for jan 3 at 2p for carotid duplex at [facility] with Eulogio Ditch

## 2021-12-26 NOTE — Op Note (Signed)
OPERATIVE NOTE DATE: 12/26/2021  PROCEDURE:  Ultrasound guidance for vascular access right femoral artery Catheter placement into right common carotid artery from right femoral approach Cervical and cerebral right carotid angiogram StarClose closure device right femoral artery  PRE-OPERATIVE DIAGNOSIS: 1.  Moderate right carotid artery stenosis with mobile plaque on duplex. 2.  Status post left carotid artery stent placement  POST-OPERATIVE DIAGNOSIS:  Same as above  SURGEON: Leotis Pain, MD  ASSISTANT(S): None  ANESTHESIA: local/MCS  ESTIMATED BLOOD LOSS: 10 cc  CONTRAST: 40 cc  FLUORO TIME: 2.9 minutes  MODERATE CONSCIOUS SEDATION TIME:  Approximately 28 minutes using 2.5 mg of Versed and 75 mcg of Fentanyl  FINDING(S): 1.   Roughly 50% right common carotid artery stenosis and irregular ulcerated stenosis in the proximal internal carotid artery of roughly 50%.  Normal intracranial filling without any significant intracranial deficits  SPECIMEN(S):   none  INDICATIONS:   Patient is a 56 y.o. male who presents with right carotid artery stenosis with concern for mobile plaque on duplex.  He is brought in for angiography and potential stent placement if that is appropriate.  Risks and benefits were discussed and informed consent was obtained.   DESCRIPTION: After obtaining full informed written consent, the patient was brought back to the vascular suite and placed supine upon the table.  The patient received IV antibiotics prior to induction. Moderate conscious sedation was administered during a face to face encounter with the patient throughout the procedure with my supervision of the RN administering medicines and monitoring the patients vital signs and mental status throughout from the start of the procedure until the patient was taken to the recovery room.  After obtaining adequate anesthesia, the patient was prepped and draped in the standard fashion.   The right femoral  artery was visualized with ultrasound and found to be widely patent. It was then accessed under direct ultrasound guidance without difficulty with a Seldinger needle. A permanent image was recorded. A J-wire was placed and we then placed a 5 French sheath. The patient was then heparinized and a total of 8000 units of intravenous heparin. A pigtail catheter was then placed into the ascending aorta. This showed a bovine configuration without any proximal stenosis. I then selectively cannulated the innominate artery without difficulty with a headhunter catheter and advanced into the mid right common carotid artery.  Cervical and cerebral carotid angiography was then performed.  Several different angles were performed of the cervical carotid artery for evaluation.  There were no obvious intracranial filling defects with normal anterior and middle cerebral artery and some cross-filling. The carotid bifurcation demonstrated extensive calcific plaque with ulcerative lesion of the distal common carotid artery and proximal internal carotid artery.  The proximal internal carotid artery had a stenosis in the 50% range.  The common carotid artery some 4 to 5 cm proximal to the bifurcation also had a stenosis in the 50% range.  Both were highly calcified and the plaque was quite irregular but it was not anything obviously mobile and no thrombus attached.  I did not feel this was appropriate for stent placement as it required at least 2 stents and it was highly calcific.  Endarterectomy may be an option, and I will discuss this versus medical management with the patient.  At this point I elected to terminate the procedure. The sheath was removed and StarClose closure device was deployed in the right femoral artery with excellent hemostatic result. The patient was taken to the recovery  room in stable condition having tolerated the procedure well.  COMPLICATIONS: none  CONDITION: stable  Leotis Pain 12/26/2021 4:01  PM   This note was created with Dragon Medical transcription system. Any errors in dictation are purely unintentional.

## 2021-12-27 ENCOUNTER — Encounter: Payer: Self-pay | Admitting: Vascular Surgery

## 2022-01-17 ENCOUNTER — Other Ambulatory Visit (INDEPENDENT_AMBULATORY_CARE_PROVIDER_SITE_OTHER): Payer: Self-pay | Admitting: Nurse Practitioner

## 2022-01-20 ENCOUNTER — Other Ambulatory Visit (INDEPENDENT_AMBULATORY_CARE_PROVIDER_SITE_OTHER): Payer: Self-pay | Admitting: Vascular Surgery

## 2022-01-20 DIAGNOSIS — Z959 Presence of cardiac and vascular implant and graft, unspecified: Secondary | ICD-10-CM

## 2022-01-20 DIAGNOSIS — I6522 Occlusion and stenosis of left carotid artery: Secondary | ICD-10-CM

## 2022-01-25 ENCOUNTER — Ambulatory Visit (INDEPENDENT_AMBULATORY_CARE_PROVIDER_SITE_OTHER): Payer: 59 | Admitting: Nurse Practitioner

## 2022-01-25 ENCOUNTER — Encounter (INDEPENDENT_AMBULATORY_CARE_PROVIDER_SITE_OTHER): Payer: Self-pay | Admitting: Nurse Practitioner

## 2022-01-25 ENCOUNTER — Ambulatory Visit (INDEPENDENT_AMBULATORY_CARE_PROVIDER_SITE_OTHER): Payer: 59

## 2022-01-25 VITALS — BP 156/76 | HR 73 | Resp 17 | Ht 72.0 in | Wt 234.0 lb

## 2022-01-25 DIAGNOSIS — I6522 Occlusion and stenosis of left carotid artery: Secondary | ICD-10-CM

## 2022-01-25 DIAGNOSIS — Z959 Presence of cardiac and vascular implant and graft, unspecified: Secondary | ICD-10-CM | POA: Diagnosis not present

## 2022-01-25 DIAGNOSIS — I739 Peripheral vascular disease, unspecified: Secondary | ICD-10-CM | POA: Diagnosis not present

## 2022-01-25 DIAGNOSIS — I6523 Occlusion and stenosis of bilateral carotid arteries: Secondary | ICD-10-CM | POA: Diagnosis not present

## 2022-01-25 DIAGNOSIS — E785 Hyperlipidemia, unspecified: Secondary | ICD-10-CM | POA: Diagnosis not present

## 2022-01-25 DIAGNOSIS — Z9889 Other specified postprocedural states: Secondary | ICD-10-CM | POA: Diagnosis not present

## 2022-01-25 NOTE — Progress Notes (Signed)
Subjective:    Patient ID: Charles Ibarra, male    DOB: 15-Dec-1965, 57 y.o.   MRN: [id number] Chief Complaint  Patient presents with   Follow-up    ultrasound    Charles Ibarra is a 57 year old male who presents today for follow-up of his carotid stenosis.  The patient has a history of a left carotid stent placement on 02/10/2021.  He recently underwent a cervical and cerebral right carotid angiogram on 12/26/2021 after a carotid duplex showed an irregular shaped plaque that was suggested to be mobile within the distal common carotid artery.  Angiogram notes that the plaque is indeed very calcific and irregular but it is not mobile and there is also not thrombus noted.  There is an approximate 50% stenosis of both ICA as well as the common carotid above the bifurcation.  Since the intervention the patient has not had any development of any neurological symptoms such as amaurosis fugax or signs symptoms of TIA and stroke.  Today studies note that he has a 40 to 59% stenosis of the right ICA with a 1 to 39% stenosis of the left ICA.  The patient had antegrade flow in his bilateral vertebral arteries.  There are normal flow hemodynamics in the bilateral subclavian arteries.  The patient also has noted peripheral arterial disease with intervention to the right lower extremity he does have noted reduced flow within the left but currently there is no limb threatening symptoms.  He does have claudication but he notes that he is able to walk about a quarter of a mile before he has any issues.      Review of Systems  Eyes:  Negative for visual disturbance.  Cardiovascular:        Claudication  Neurological:  Negative for dizziness and weakness.  All other systems reviewed and are negative.      Objective:   Physical Exam Vitals reviewed.  HENT:     Head: Normocephalic.  Neck:     Vascular: No carotid bruit.  Cardiovascular:     Rate and Rhythm: Normal rate.  Pulmonary:     Effort:  Pulmonary effort is normal.  Skin:    General: Skin is warm and dry.  Neurological:     Mental Status: He is alert and oriented to person, place, and time.  Psychiatric:        Mood and Affect: Mood normal.        Behavior: Behavior normal.        Thought Content: Thought content normal.        Judgment: Judgment normal.     BP (!) 156/76 (BP Location: Left Arm)   Pulse 73   Resp 17   Ht 6' (1.829 m)   Wt 234 lb (106.1 kg)   BMI 31.74 kg/m   Past Medical History:  Diagnosis Date   Alcohol abuse    Aortic stenosis    Atherosclerotic PVD with intermittent claudication (HCC)    Chronic kidney disease, stage 3 (moderate)    Coronary artery disease involving native coronary artery of native heart with angina pectoris (HCC)    HTN (hypertension)    Hyperlipidemia    Ischemic cardiomyopathy    S/P CABG (coronary artery bypass graft)     Social History   Socioeconomic History   Marital status: Divorced    Spouse name: Not on file   Number of children: 1   Years of education: Not on file   Highest education level: Not  on file  Occupational History   Not on file  Tobacco Use   Smoking status: Former    Types: Cigarettes    Quit date: 04/02/2010    Years since quitting: 11.8   Smokeless tobacco: Never  Substance and Sexual Activity   Alcohol use: Yes    Alcohol/week: 18.0 standard drinks of alcohol    Types: 18 Cans of beer per week    Comment: 6-7 beers every other day (natural light)   Drug use: Not Currently    Comment: CBD (smokable )   Sexual activity: Not Currently  Other Topics Concern   Not on file  Social History Narrative   Daughter , who lives in [place], [place]   Social Determinants of Health   Financial Resource Strain: Not on file  Food Insecurity: Not on file  Transportation Needs: Not on file  Physical Activity: Not on file  Stress: Not on file  Social Connections: Not on file  Intimate Partner Violence: Not on file    Past Surgical  History:  Procedure Laterality Date   BACK SURGERY     CAROTID ANGIOGRAPHY Right 12/26/2021   Procedure: CAROTID ANGIOGRAPHY;  Surgeon: Algernon Huxley, MD;  Location: [facility];  Service: Cardiovascular;  Laterality: Right;   CAROTID PTA/STENT INTERVENTION Left 02/10/2021   Procedure: CAROTID PTA/STENT INTERVENTION;  Surgeon: Algernon Huxley, MD;  Location: [facility];  Service: Cardiovascular;  Laterality: Left;   COLONOSCOPY     CORONARY ARTERY BYPASS GRAFT     LOWER EXTREMITY ANGIOGRAPHY Right 07/11/2021   Procedure: Lower Extremity Angiography;  Surgeon: Algernon Huxley, MD;  Location: [facility];  Service: Cardiovascular;  Laterality: Right;   NASAL FRACTURE SURGERY     TONSILLECTOMY     WISDOM TOOTH EXTRACTION      History reviewed. No pertinent family history.  No Known Allergies     Latest Ref Rng & Units 02/11/2021    5:22 AM  CBC  WBC 4.0 - 10.5 K/uL 5.7   Hemoglobin 13.0 - 17.0 g/dL 10.6   Hematocrit 39.0 - 52.0 % 31.7   Platelets 150 - 400 K/uL 118       CMP     Component Value Date/Time   NA 139 12/26/2021 1437   K 3.8 12/26/2021 1437   CL 102 12/26/2021 1437   CO2 26 12/26/2021 1437   GLUCOSE 130 (H) 12/26/2021 1437   BUN 27 (H) 12/26/2021 1437   CREATININE 1.31 (H) 12/26/2021 1437   CALCIUM 9.8 12/26/2021 1437   GFRNONAA >60 12/26/2021 1437     No results found.     Assessment & Plan:   1. Bilateral carotid artery stenosis Following carotid angiogram it was noted that the patient did not have any mobile or thrombotic plaque however he did have some very irregular and calcific plaque.  However there were only in the 50% range.  Endarterectomy versus medical management was discussed with the patient and at this time because he is not having any significant issues or symptoms he currently opts for medical management.  Based on this we will have the patient continue his aspirin, Plavix and statin.  Will have the patient return in 3  months for noninvasive studies  2. Peripheral arterial disease with history of revascularization [facility]) Currently the patient continues to have claudication but it is stable.  No worsening to rest pain or ulceration.  Based on this patient is advised to continue with activity.  We will have her return in 3 months with noninvasive studies or sooner as if his issues worsen.  3. Hyperlipidemia, unspecified hyperlipidemia type Continue statin as ordered and reviewed, no changes at this time   Current Outpatient Medications on File Prior to Visit  Medication Sig Dispense Refill   aspirin EC 81 MG tablet Take 81 mg by mouth daily.     atorvastatin (LIPITOR) 80 MG tablet Take 80 mg by mouth at bedtime.     carvedilol (COREG) 12.5 MG tablet Take 12.5 mg by mouth 2 (two) times daily with a meal.     clopidogrel (PLAVIX) 75 MG tablet Take 1 tablet by mouth once daily 90 tablet 0   co-enzyme Q-10 30 MG capsule Take 40 mg by mouth daily.     EPINEPHrine 0.3 mg/0.3 mL IJ SOAJ injection      levocetirizine (XYZAL) 5 MG tablet Take 5 mg by mouth every evening.     losartan (COZAAR) 100 MG tablet Take 100 mg by mouth daily.     nitroGLYCERIN (NITROSTAT) 0.4 MG SL tablet Place under the tongue.     torsemide (DEMADEX) 20 MG tablet Take 20 mg by mouth daily.     No current facility-administered medications on file prior to visit.    There are no Patient Instructions on file for this visit. No follow-ups on file.   Kris Hartmann, NP

## 2022-03-08 ENCOUNTER — Encounter: Payer: Self-pay | Admitting: Gastroenterology

## 2022-03-09 ENCOUNTER — Ambulatory Visit: Payer: 59 | Admitting: Anesthesiology

## 2022-03-09 ENCOUNTER — Encounter
Admission: RE | Disposition: A | Discharge status: Home / Self Care | Payer: Self-pay | Source: Home / Self Care | Attending: Gastroenterology

## 2022-03-09 ENCOUNTER — Encounter: Payer: Self-pay | Admitting: Gastroenterology

## 2022-03-09 ENCOUNTER — Ambulatory Visit
Admission: RE | Admit: 2022-03-09 | Discharge: 2022-03-09 | Disposition: A | Discharge status: Home / Self Care | Payer: 59 | Attending: Gastroenterology | Admitting: Gastroenterology

## 2022-03-09 DIAGNOSIS — K640 First degree hemorrhoids: Secondary | ICD-10-CM | POA: Insufficient documentation

## 2022-03-09 DIAGNOSIS — Q438 Other specified congenital malformations of intestine: Secondary | ICD-10-CM | POA: Diagnosis not present

## 2022-03-09 DIAGNOSIS — Z7902 Long term (current) use of antithrombotics/antiplatelets: Secondary | ICD-10-CM | POA: Diagnosis not present

## 2022-03-09 DIAGNOSIS — E1151 Type 2 diabetes mellitus with diabetic peripheral angiopathy without gangrene: Secondary | ICD-10-CM | POA: Insufficient documentation

## 2022-03-09 DIAGNOSIS — N183 Chronic kidney disease, stage 3 unspecified: Secondary | ICD-10-CM | POA: Diagnosis not present

## 2022-03-09 DIAGNOSIS — E1122 Type 2 diabetes mellitus with diabetic chronic kidney disease: Secondary | ICD-10-CM | POA: Insufficient documentation

## 2022-03-09 DIAGNOSIS — I2510 Atherosclerotic heart disease of native coronary artery without angina pectoris: Secondary | ICD-10-CM | POA: Diagnosis not present

## 2022-03-09 DIAGNOSIS — D122 Benign neoplasm of ascending colon: Secondary | ICD-10-CM | POA: Diagnosis not present

## 2022-03-09 DIAGNOSIS — Z951 Presence of aortocoronary bypass graft: Secondary | ICD-10-CM | POA: Insufficient documentation

## 2022-03-09 DIAGNOSIS — D124 Benign neoplasm of descending colon: Secondary | ICD-10-CM | POA: Diagnosis not present

## 2022-03-09 DIAGNOSIS — I129 Hypertensive chronic kidney disease with stage 1 through stage 4 chronic kidney disease, or unspecified chronic kidney disease: Secondary | ICD-10-CM | POA: Insufficient documentation

## 2022-03-09 DIAGNOSIS — Z86010 Personal history of colonic polyps: Secondary | ICD-10-CM | POA: Diagnosis not present

## 2022-03-09 DIAGNOSIS — Z1211 Encounter for screening for malignant neoplasm of colon: Secondary | ICD-10-CM | POA: Insufficient documentation

## 2022-03-09 DIAGNOSIS — K5730 Diverticulosis of large intestine without perforation or abscess without bleeding: Secondary | ICD-10-CM | POA: Diagnosis not present

## 2022-03-09 DIAGNOSIS — I252 Old myocardial infarction: Secondary | ICD-10-CM | POA: Diagnosis not present

## 2022-03-09 DIAGNOSIS — Z87891 Personal history of nicotine dependence: Secondary | ICD-10-CM | POA: Insufficient documentation

## 2022-03-09 DIAGNOSIS — K635 Polyp of colon: Secondary | ICD-10-CM | POA: Diagnosis not present

## 2022-03-09 DIAGNOSIS — D123 Benign neoplasm of transverse colon: Secondary | ICD-10-CM | POA: Insufficient documentation

## 2022-03-09 DIAGNOSIS — D128 Benign neoplasm of rectum: Secondary | ICD-10-CM | POA: Insufficient documentation

## 2022-03-09 DIAGNOSIS — K649 Unspecified hemorrhoids: Secondary | ICD-10-CM | POA: Diagnosis not present

## 2022-03-09 HISTORY — PX: COLONOSCOPY: SHX5424

## 2022-03-09 HISTORY — DX: Type 2 diabetes mellitus without complications: E11.9

## 2022-03-09 LAB — GLUCOSE, CAPILLARY: Glucose-Capillary: 166 mg/dL — ABNORMAL HIGH (ref 70–99)

## 2022-03-09 SURGERY — COLONOSCOPY
Anesthesia: General

## 2022-03-09 MED ORDER — PROPOFOL 10 MG/ML IV BOLUS
INTRAVENOUS | Status: DC | PRN
  Administered 2022-03-09: 50 mg via INTRAVENOUS

## 2022-03-09 MED ORDER — FENTANYL CITRATE (PF) 100 MCG/2ML IJ SOLN
INTRAMUSCULAR | Status: DC | PRN
  Administered 2022-03-09: 50 ug via INTRAVENOUS

## 2022-03-09 MED ORDER — SODIUM CHLORIDE 0.9 % IV SOLN: INTRAVENOUS | Status: DC

## 2022-03-09 MED ORDER — EPHEDRINE SULFATE (PRESSORS) 50 MG/ML IJ SOLN
INTRAMUSCULAR | Status: DC | PRN
  Administered 2022-03-09 (×2): 5 mg via INTRAVENOUS
  Administered 2022-03-09: 10 mg via INTRAVENOUS

## 2022-03-09 MED ORDER — PROPOFOL 500 MG/50ML IV EMUL
INTRAVENOUS | Status: DC | PRN
  Administered 2022-03-09: 125 ug/kg/min via INTRAVENOUS

## 2022-03-09 MED ORDER — MIDAZOLAM HCL 2 MG/2ML IJ SOLN
INTRAMUSCULAR | Status: DC | PRN
  Administered 2022-03-09: 2 mg via INTRAVENOUS

## 2022-03-09 MED ORDER — FENTANYL CITRATE (PF) 100 MCG/2ML IJ SOLN
INTRAMUSCULAR | Status: AC
  Filled 2022-03-09: qty 2

## 2022-03-09 MED ORDER — MIDAZOLAM HCL 2 MG/2ML IJ SOLN
INTRAMUSCULAR | Status: AC
  Filled 2022-03-09: qty 2

## 2022-03-09 NOTE — Interval H&P Note (Signed)
History and Physical Interval Note: Preprocedure H&P from 03/09/22  was reviewed and there was no interval change after seeing and examining the patient.  Written consent was obtained from the patient after discussion of risks, benefits, and alternatives. Patient has consented to proceed with Colonoscopy with possible intervention   03/09/2022 9:19 AM  Charles Ibarra  has presented today for surgery, with the diagnosis of Hx of adenomatous polyp of colon (Z86.010).  The various methods of treatment have been discussed with the patient and family. After consideration of risks, benefits and other options for treatment, the patient has consented to  Procedure(s): COLONOSCOPY (N/A) as a surgical intervention.  [name] history has been reviewed, patient examined, no change in status, stable for surgery.  I have reviewed the patient's chart and labs.  Questions were answered to the patient's satisfaction.     [name]

## 2022-03-09 NOTE — H&P (Signed)
Pre-Procedure H&P   Patient ID: Charles Ibarra is a 57 y.o. male.  Gastroenterology Provider: [name], DO  Referring Provider: Dawson Bills, NP PCP: Kirk Ruths, MD  Date: 03/09/2022  HPI Charles Ibarra is a 57 y.o. male who presents today for Colonoscopy for Surveillance-personal history colon polyps .  Patient on Plavix was been held for this procedure with clearance from vascular surgeon (last dose 03/03/22)  Patient with previous colonoscopy in March 2016 demonstrated 1 tubular adenoma in the ascending colon.  Sigmoid diverticulosis and internal hemorrhoids noted.  Patient reports daily bowel movement without melena hematochezia diarrhea or constipation.  Most recent lab work hemoglobin 12.7 MCV 95 platelets 175,000 A1c 6.1 INR 1.1 iron saturation 43% ferritin 370 creatinine 1.3 heterozygote hemochromatosis H63D.  Status post CABG in 2012.  EtOH use.  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   Past Medical History:  Diagnosis Date   Alcohol abuse    Aortic stenosis    Aortic stenosis    Atherosclerotic PVD with intermittent claudication (HCC)    Chronic kidney disease, stage 3 (moderate)    Coronary artery disease involving native coronary artery of native heart with angina pectoris (HCC)    Diabetes mellitus without complication (HCC)    HTN (hypertension)    Hyperlipidemia    Ischemic cardiomyopathy    S/P CABG (coronary artery bypass graft)     Past Surgical History:  Procedure Laterality Date   BACK SURGERY     CAROTID ANGIOGRAPHY Right 12/26/2021   Procedure: CAROTID ANGIOGRAPHY;  Surgeon: Algernon Huxley, MD;  Location: [facility];  Service: Cardiovascular;  Laterality: Right;   CAROTID PTA/STENT INTERVENTION Left 02/10/2021   Procedure: CAROTID PTA/STENT INTERVENTION;  Surgeon: Algernon Huxley, MD;  Location: [facility];  Service: Cardiovascular;  Laterality: Left;   COLONOSCOPY     CORONARY ARTERY BYPASS GRAFT      LOWER EXTREMITY ANGIOGRAPHY Right 07/11/2021   Procedure: Lower Extremity Angiography;  Surgeon: Algernon Huxley, MD;  Location: [facility];  Service: Cardiovascular;  Laterality: Right;   NASAL FRACTURE SURGERY     TONSILLECTOMY     WISDOM TOOTH EXTRACTION      Family History No h/o GI disease or malignancy  Review of Systems  Constitutional:  Negative for activity change, appetite change, chills, diaphoresis, fatigue, fever and unexpected weight change.  HENT:  Negative for trouble swallowing and voice change.   Respiratory:  Negative for shortness of breath and wheezing.   Cardiovascular:  Negative for chest pain, palpitations and leg swelling.  Gastrointestinal:  Negative for abdominal distention, abdominal pain, anal bleeding, blood in stool, constipation, diarrhea, nausea and vomiting.  Musculoskeletal:  Negative for arthralgias and myalgias.  Skin:  Negative for color change and pallor.  Neurological:  Negative for dizziness, syncope and weakness.  Psychiatric/Behavioral:  Negative for confusion. The patient is not nervous/anxious.   All other systems reviewed and are negative.    Medications No current facility-administered medications on file prior to encounter.   Current Outpatient Medications on File Prior to Encounter  Medication Sig Dispense Refill   aspirin EC 81 MG tablet Take 81 mg by mouth daily.     atorvastatin (LIPITOR) 80 MG tablet Take 80 mg by mouth at bedtime.     carvedilol (COREG) 12.5 MG tablet Take 12.5 mg by mouth 2 (two) times daily with a meal.     losartan (COZAAR) 100 MG tablet Take 100  mg by mouth daily.     torsemide (DEMADEX) 20 MG tablet Take 20 mg by mouth daily.     EPINEPHrine 0.3 mg/0.3 mL IJ SOAJ injection      levocetirizine (XYZAL) 5 MG tablet Take 5 mg by mouth every evening.     nitroGLYCERIN (NITROSTAT) 0.4 MG SL tablet Place under the tongue.      Pertinent medications related to GI and procedure were reviewed by me with the  patient prior to the procedure   Current Facility-Administered Medications:    0.9 %  sodium chloride infusion, , Intravenous, Continuous, [name], DO, Last Rate: 20 mL/hr at 03/09/22 0835, New Bag at 03/09/22 0835      No Known Allergies Allergies were reviewed by me prior to the procedure  Objective   Body mass index is 31.55 kg/m. Vitals:   03/09/22 0820  BP: (!) 168/73  Pulse: 61  Resp: 16  Temp: (!) 96.2 F (35.7 C)  TempSrc: Temporal  SpO2: 97%  Weight: 105.5 kg     Physical Exam Vitals and nursing note reviewed.  Constitutional:      General: He is not in acute distress.    Appearance: Normal appearance. He is obese. He is not ill-appearing, toxic-appearing or diaphoretic.  HENT:     Head: Normocephalic and atraumatic.     Nose: Nose normal.     Mouth/Throat:     Mouth: Mucous membranes are moist.     Pharynx: Oropharynx is clear.  Eyes:     General: No scleral icterus.    Extraocular Movements: Extraocular movements intact.  Cardiovascular:     Rate and Rhythm: Normal rate and regular rhythm.     Heart sounds: Normal heart sounds. No murmur heard.    No friction rub. No gallop.  Pulmonary:     Effort: Pulmonary effort is normal. No respiratory distress.     Breath sounds: Normal breath sounds. No wheezing, rhonchi or rales.  Abdominal:     General: Bowel sounds are normal. There is no distension.     Palpations: Abdomen is soft.     Tenderness: There is no abdominal tenderness. There is no guarding or rebound.  Musculoskeletal:     Cervical back: Neck supple.     Right lower leg: No edema.     Left lower leg: No edema.  Skin:    General: Skin is warm and dry.     Coloration: Skin is not jaundiced or pale.  Neurological:     General: No focal deficit present.     Mental Status: He is alert and oriented to person, place, and time. Mental status is at baseline.  Psychiatric:        Mood and Affect: Mood normal.        Behavior:  Behavior normal.        Thought Content: Thought content normal.        Judgment: Judgment normal.      Assessment:  Charles Ibarra is a 57 y.o. male  who presents today for Colonoscopy for Surveillance-personal history colon polyps .  Plan:  Colonoscopy with possible intervention today  Colonoscopy with possible biopsy, control of bleeding, polypectomy, and interventions as necessary has been discussed with the patient/patient representative. Informed consent was obtained from the patient/patient representative after explaining the indication, nature, and risks of the procedure including but not limited to death, bleeding, perforation, missed neoplasm/lesions, cardiorespiratory compromise, and reaction to medications. Opportunity for questions was given  and appropriate answers were provided. Patient/patient representative has verbalized understanding is amenable to undergoing the procedure.   [name], DO  [facility]  Portions of the record may have been created with voice recognition software. Occasional wrong-word or 'sound-a-like' substitutions may have occurred due to the inherent limitations of voice recognition software.  Read the chart carefully and recognize, using context, where substitutions may have occurred.

## 2022-03-09 NOTE — Op Note (Signed)
[facility] Gastroenterology Patient Name: Charles Ibarra Procedure Date: 03/09/2022 9:14 AM MRN: [id number] Account #: [id number] Date of Birth: [birth year] Admit Type: Outpatient Age: 57 Room: [facility] ENDO ROOM 1 Gender: Male Note Status: Finalized Instrument Name: Colonoscope [id number] Procedure:             Colonoscopy Indications:           High risk colon cancer surveillance: Personal history                         of colonic polyps Providers:             Rueben Bash, DO Referring MD:          Ocie Cornfield. Ouida Sills MD, MD (Referring MD) Medicines:             Monitored Anesthesia Care Complications:         No immediate complications. Estimated blood loss:                         Minimal. Procedure:             Pre-Anesthesia Assessment:                        - Prior to the procedure, a History and Physical was                         performed, and patient medications and allergies were                         reviewed. The patient is competent. The risks and                         benefits of the procedure and the sedation options and                         risks were discussed with the patient. All questions                         were answered and informed consent was obtained.                         Patient identification and proposed procedure were                         verified by the physician, the nurse, the anesthetist                         and the technician in the endoscopy suite. Mental                         Status Examination: alert and oriented. Airway                         Examination: normal oropharyngeal airway and neck                         mobility. Respiratory Examination: clear to  auscultation. CV Examination: RRR, no murmurs, no S3                         or S4. Prophylactic Antibiotics: The patient does not                         require prophylactic antibiotics. Prior                          Anticoagulants: The patient has taken Plavix                         (clopidogrel), last dose was 5 days prior to                         procedure. ASA Grade Assessment: III - A patient with                         severe systemic disease. After reviewing the risks and                         benefits, the patient was deemed in satisfactory                         condition to undergo the procedure. The anesthesia                         plan was to use monitored anesthesia care (MAC).                         Immediately prior to administration of medications,                         the patient was re-assessed for adequacy to receive                         sedatives. The heart rate, respiratory rate, oxygen                         saturations, blood pressure, adequacy of pulmonary                         ventilation, and response to care were monitored                         throughout the procedure. The physical status of the                         patient was re-assessed after the procedure.                        After obtaining informed consent, the colonoscope was                         passed under direct vision. Throughout the procedure,                         the patient's blood pressure, pulse, and oxygen  saturations were monitored continuously. The                         Colonoscope was introduced through the anus and                         advanced to the the cecum, identified by appendiceal                         orifice and ileocecal valve. The colonoscopy was                         performed without difficulty. The patient tolerated                         the procedure well. The quality of the bowel                         preparation was evaluated using the BBPS [facility] Bowel                         Preparation Scale) with scores of: Right Colon = 2                         (minor amount of residual staining, small fragments of                          stool and/or opaque liquid, but mucosa seen well),                         Transverse Colon = 2 (minor amount of residual                         staining, small fragments of stool and/or opaque                         liquid, but mucosa seen well) and Left Colon = 2                         (minor amount of residual staining, small fragments of                         stool and/or opaque liquid, but mucosa seen well). The                         total BBPS score equals 6. The quality of the bowel                         preparation was good. The ileocecal valve, appendiceal                         orifice, and rectum were photographed. Findings:      The perianal and digital rectal examinations were normal. Pertinent       negatives include normal sphincter tone.      Multiple small-mouthed diverticula were found in the entire colon. Most       prevalent in left colon Estimated blood  loss: none.      Non-bleeding internal hemorrhoids were found during retroflexion. The       hemorrhoids were Grade I (internal hemorrhoids that do not prolapse).       Estimated blood loss: none.      The colon (entire examined portion) was mildly redundant. Estimated       blood loss: none.      Five sessile polyps were found in the rectum (2), transverse colon (2)       and ascending colon (1). The polyps were 1 to 2 mm in size. These polyps       were removed with a jumbo cold forceps. Resection and retrieval were       complete. Estimated blood loss was minimal.      Four sessile polyps were found in the descending colon (1), transverse       colon (2) and ascending colon (1). The polyps were 3 to 5 mm in size.       These polyps were removed with a cold snare. Resection and retrieval       were complete. Estimated blood loss was minimal.      The exam was otherwise without abnormality on direct and retroflexion       views. Impression:            - Diverticulosis in the entire examined  colon.                        - Non-bleeding internal hemorrhoids.                        - Redundant colon.                        - Five 1 to 2 mm polyps in the rectum, in the                         transverse colon and in the ascending colon, removed                         with a jumbo cold forceps. Resected and retrieved.                        - Four 3 to 5 mm polyps in the descending colon, in                         the transverse colon and in the ascending colon,                         removed with a cold snare. Resected and retrieved.                        - The examination was otherwise normal on direct and                         retroflexion views. Recommendation:        - Patient has a contact number available for                         emergencies. The signs and symptoms of potential  delayed complications were discussed with the patient.                         Return to normal activities tomorrow. Written                         discharge instructions were provided to the patient.                        - Discharge patient to home.                        - Resume previous diet.                        - Continue present medications.                        - Await pathology results.                        - Repeat colonoscopy for surveillance based on                         pathology results.                        - Return to GI office as previously scheduled.                        - No aspirin, ibuprofen, naproxen, or other                         non-steroidal anti-inflammatory drugs for 5 days after                         polyp removal.                        - Resume Plavix (clopidogrel) at prior dose in 3 days.                         Refer to managing physician for further adjustment of                         therapy.                        - The findings and recommendations were discussed with                         the  patient. Procedure Code(s):     --- Professional ---                        [phone], Colonoscopy, flexible; with removal of                         tumor(s), polyp(s), or other lesion(s) by snare                         technique  70350, 65, Colonoscopy, flexible; with biopsy, single                         or multiple Diagnosis Code(s):     --- Professional ---                        Z86.010, Personal history of colonic polyps                        K64.0, First degree hemorrhoids                        D12.8, Benign neoplasm of rectum                        D12.4, Benign neoplasm of descending colon                        D12.3, Benign neoplasm of transverse colon (hepatic                         flexure or splenic flexure)                        D12.2, Benign neoplasm of ascending colon                        K57.30, Diverticulosis of large intestine without                         perforation or abscess without bleeding                        Q43.8, Other specified congenital malformations of                         intestine CPT copyright 2022 American Medical Association. All rights reserved. The codes documented in this report are preliminary and upon coder review may  be revised to meet current compliance requirements. Attending Participation:      I personally performed the entire procedure. Volney American, DO [name] DO, DO 03/09/2022 10:15:06 AM This report has been signed electronically. Number of Addenda: 0 Note Initiated On: 03/09/2022 9:14 AM Scope Withdrawal Time: 0 hours 23 minutes 26 seconds  Total Procedure Duration: 0 hours 28 minutes 38 seconds  Estimated Blood Loss:  Estimated blood loss was minimal.      [facility]

## 2022-03-09 NOTE — Anesthesia Preprocedure Evaluation (Signed)
Anesthesia Evaluation  Patient identified by MRN, date of birth, ID band Patient awake    Reviewed: Allergy & Precautions, H&P , NPO status , Patient's Chart, lab work & pertinent test results, reviewed documented beta blocker date and time   History of Anesthesia Complications Negative for: history of anesthetic complications  Airway Mallampati: II  TM Distance: >3 FB Neck ROM: full    Dental  (+) Caps, Dental Advidsory Given, Missing, Poor Dentition   Pulmonary neg shortness of breath, sleep apnea , neg COPD, neg recent URI, former smoker   breath sounds clear to auscultation       Cardiovascular Exercise Tolerance: Good hypertension, (-) angina + CAD, + Past MI, + CABG and + Peripheral Vascular Disease  (-) Cardiac Stents Normal cardiovascular exam(-) dysrhythmias (-) Valvular Problems/Murmurs Rhythm:regular Rate:Normal     Neuro/Psych negative neurological ROS  negative psych ROS   GI/Hepatic negative GI ROS, Neg liver ROS,,,  Endo/Other  diabetes, Well Controlled    Renal/GU CRFRenal disease  negative genitourinary   Musculoskeletal   Abdominal   Peds  Hematology negative hematology ROS (+)   Anesthesia Other Findings Past Medical History: No date: Alcohol abuse No date: Aortic stenosis No date: Aortic stenosis No date: Atherosclerotic PVD with intermittent claudication (HCC) No date: Chronic kidney disease, stage 3 (moderate) No date: Coronary artery disease involving native coronary artery of  native heart with angina pectoris (HCC) No date: Diabetes mellitus without complication (HCC) No date: HTN (hypertension) No date: Hyperlipidemia No date: Ischemic cardiomyopathy No date: S/P CABG (coronary artery bypass graft)   Reproductive/Obstetrics negative OB ROS                             Anesthesia Physical Anesthesia Plan  ASA: 3  Anesthesia Plan: General   Post-op Pain  Management:    Induction: Intravenous  PONV Risk Score and Plan: 2 and Propofol infusion and TIVA  Airway Management Planned: Natural Airway and Nasal Cannula  Additional Equipment:   Intra-op Plan:   Post-operative Plan:   Informed Consent: I have reviewed the patients History and Physical, chart, labs and discussed the procedure including the risks, benefits and alternatives for the proposed anesthesia with the patient or authorized representative who has indicated his/her understanding and acceptance.     Dental Advisory Given  Plan Discussed with: Anesthesiologist, CRNA and Surgeon  Anesthesia Plan Comments:        Anesthesia Quick Evaluation

## 2022-03-09 NOTE — Transfer of Care (Signed)
Immediate Anesthesia Transfer of Care Note  Patient: Charles Ibarra  Procedure(s) Performed: COLONOSCOPY  Patient Location: PACU  Anesthesia Type:General  Level of Consciousness: awake, alert , and oriented  Airway & Oxygen Therapy: Patient Spontanous Breathing and Patient connected to nasal cannula oxygen  Post-op Assessment: Report given to RN, Post -op Vital signs reviewed and stable, and Patient moving all extremities  Post vital signs: Reviewed and stable  Last Vitals:  Vitals Value Taken Time  BP 113/51 03/09/22 1008  Temp 36 C 03/09/22 1007  Pulse 75 03/09/22 1008  Resp 14 03/09/22 1008  SpO2 97 % 03/09/22 1008    Last Pain:  Vitals:   03/09/22 1008  TempSrc:   PainSc: 0-No pain         Complications: No notable events documented.

## 2022-03-10 ENCOUNTER — Encounter: Payer: Self-pay | Admitting: Gastroenterology

## 2022-03-10 LAB — SURGICAL PATHOLOGY

## 2022-03-16 NOTE — Anesthesia Postprocedure Evaluation (Signed)
Anesthesia Post Note  Patient: Charles Ibarra  Procedure(s) Performed: COLONOSCOPY  Patient location during evaluation: Endoscopy Anesthesia Type: General Level of consciousness: awake and alert Pain management: pain level controlled Vital Signs Assessment: post-procedure vital signs reviewed and stable Respiratory status: spontaneous breathing, nonlabored ventilation, respiratory function stable and patient connected to nasal cannula oxygen Cardiovascular status: blood pressure returned to baseline and stable Postop Assessment: no apparent nausea or vomiting Anesthetic complications: no   No notable events documented.   Last Vitals:  Vitals:   03/09/22 1009 03/09/22 1027  BP: 137/68 (!) 148/79  Pulse: 65 (!) 52  Resp: 15 19  Temp:    SpO2: 96% 96%    Last Pain:  Vitals:   03/10/22 0737  TempSrc:   PainSc: 0-No pain                 Martha Clan

## 2022-03-17 DIAGNOSIS — I70219 Atherosclerosis of native arteries of extremities with intermittent claudication, unspecified extremity: Secondary | ICD-10-CM | POA: Diagnosis not present

## 2022-03-17 DIAGNOSIS — I779 Disorder of arteries and arterioles, unspecified: Secondary | ICD-10-CM | POA: Diagnosis not present

## 2022-03-17 DIAGNOSIS — E118 Type 2 diabetes mellitus with unspecified complications: Secondary | ICD-10-CM | POA: Diagnosis not present

## 2022-03-17 DIAGNOSIS — I2510 Atherosclerotic heart disease of native coronary artery without angina pectoris: Secondary | ICD-10-CM | POA: Diagnosis not present

## 2022-03-17 DIAGNOSIS — I10 Essential (primary) hypertension: Secondary | ICD-10-CM | POA: Diagnosis not present

## 2022-03-17 DIAGNOSIS — Z23 Encounter for immunization: Secondary | ICD-10-CM | POA: Diagnosis not present

## 2022-03-17 DIAGNOSIS — E782 Mixed hyperlipidemia: Secondary | ICD-10-CM | POA: Diagnosis not present

## 2022-03-17 DIAGNOSIS — I350 Nonrheumatic aortic (valve) stenosis: Secondary | ICD-10-CM | POA: Diagnosis not present

## 2022-03-17 DIAGNOSIS — I255 Ischemic cardiomyopathy: Secondary | ICD-10-CM | POA: Diagnosis not present

## 2022-03-17 DIAGNOSIS — Z951 Presence of aortocoronary bypass graft: Secondary | ICD-10-CM | POA: Diagnosis not present

## 2022-04-07 DIAGNOSIS — I10 Essential (primary) hypertension: Secondary | ICD-10-CM | POA: Diagnosis not present

## 2022-04-07 DIAGNOSIS — Z125 Encounter for screening for malignant neoplasm of prostate: Secondary | ICD-10-CM | POA: Diagnosis not present

## 2022-04-07 DIAGNOSIS — I2510 Atherosclerotic heart disease of native coronary artery without angina pectoris: Secondary | ICD-10-CM | POA: Diagnosis not present

## 2022-04-07 DIAGNOSIS — E118 Type 2 diabetes mellitus with unspecified complications: Secondary | ICD-10-CM | POA: Diagnosis not present

## 2022-04-07 DIAGNOSIS — I255 Ischemic cardiomyopathy: Secondary | ICD-10-CM | POA: Diagnosis not present

## 2022-04-14 DIAGNOSIS — E1122 Type 2 diabetes mellitus with diabetic chronic kidney disease: Secondary | ICD-10-CM | POA: Diagnosis not present

## 2022-04-14 DIAGNOSIS — N183 Chronic kidney disease, stage 3 unspecified: Secondary | ICD-10-CM | POA: Diagnosis not present

## 2022-04-14 DIAGNOSIS — Z0000 Encounter for general adult medical examination without abnormal findings: Secondary | ICD-10-CM | POA: Diagnosis not present

## 2022-04-14 DIAGNOSIS — E118 Type 2 diabetes mellitus with unspecified complications: Secondary | ICD-10-CM | POA: Diagnosis not present

## 2022-04-14 DIAGNOSIS — I2510 Atherosclerotic heart disease of native coronary artery without angina pectoris: Secondary | ICD-10-CM | POA: Diagnosis not present

## 2022-04-14 DIAGNOSIS — I70219 Atherosclerosis of native arteries of extremities with intermittent claudication, unspecified extremity: Secondary | ICD-10-CM | POA: Diagnosis not present

## 2022-04-14 DIAGNOSIS — I779 Disorder of arteries and arterioles, unspecified: Secondary | ICD-10-CM | POA: Diagnosis not present

## 2022-04-19 ENCOUNTER — Other Ambulatory Visit: Payer: Self-pay

## 2022-04-19 DIAGNOSIS — I6523 Occlusion and stenosis of bilateral carotid arteries: Secondary | ICD-10-CM

## 2022-04-19 DIAGNOSIS — Z9889 Other specified postprocedural states: Secondary | ICD-10-CM

## 2022-04-28 ENCOUNTER — Ambulatory Visit (INDEPENDENT_AMBULATORY_CARE_PROVIDER_SITE_OTHER): Payer: 59

## 2022-04-28 ENCOUNTER — Ambulatory Visit (INDEPENDENT_AMBULATORY_CARE_PROVIDER_SITE_OTHER): Payer: 59 | Admitting: Vascular Surgery

## 2022-04-28 ENCOUNTER — Encounter (INDEPENDENT_AMBULATORY_CARE_PROVIDER_SITE_OTHER): Payer: Self-pay

## 2022-04-28 DIAGNOSIS — I739 Peripheral vascular disease, unspecified: Secondary | ICD-10-CM | POA: Diagnosis not present

## 2022-04-28 DIAGNOSIS — Z9889 Other specified postprocedural states: Secondary | ICD-10-CM

## 2022-04-28 DIAGNOSIS — I6523 Occlusion and stenosis of bilateral carotid arteries: Secondary | ICD-10-CM | POA: Diagnosis not present

## 2022-05-01 LAB — VAS US ABI WITH/WO TBI
Left ABI: 0.63
Right ABI: 1.04

## 2022-05-05 ENCOUNTER — Other Ambulatory Visit (INDEPENDENT_AMBULATORY_CARE_PROVIDER_SITE_OTHER): Payer: Self-pay | Admitting: Nurse Practitioner

## 2022-05-26 ENCOUNTER — Encounter (INDEPENDENT_AMBULATORY_CARE_PROVIDER_SITE_OTHER): Payer: 59

## 2022-05-26 ENCOUNTER — Ambulatory Visit (INDEPENDENT_AMBULATORY_CARE_PROVIDER_SITE_OTHER): Payer: 59 | Admitting: Vascular Surgery

## 2022-06-07 ENCOUNTER — Other Ambulatory Visit: Payer: Self-pay | Admitting: Nurse Practitioner

## 2022-06-07 DIAGNOSIS — R7989 Other specified abnormal findings of blood chemistry: Secondary | ICD-10-CM

## 2022-06-07 DIAGNOSIS — K760 Fatty (change of) liver, not elsewhere classified: Secondary | ICD-10-CM

## 2022-06-07 DIAGNOSIS — R160 Hepatomegaly, not elsewhere classified: Secondary | ICD-10-CM

## 2022-06-14 ENCOUNTER — Encounter: Payer: Self-pay | Admitting: Gastroenterology

## 2022-06-22 ENCOUNTER — Ambulatory Visit
Admission: RE | Admit: 2022-06-22 | Discharge: 2022-06-22 | Disposition: A | Discharge status: Home / Self Care | Payer: 59 | Source: Ambulatory Visit | Attending: Nurse Practitioner | Admitting: Nurse Practitioner

## 2022-06-22 DIAGNOSIS — R7989 Other specified abnormal findings of blood chemistry: Secondary | ICD-10-CM

## 2022-06-22 DIAGNOSIS — R160 Hepatomegaly, not elsewhere classified: Secondary | ICD-10-CM

## 2022-06-22 DIAGNOSIS — K760 Fatty (change of) liver, not elsewhere classified: Secondary | ICD-10-CM

## 2022-06-22 MED ORDER — GADOPICLENOL 0.5 MMOL/ML IV SOLN
10.0000 mL | Freq: Once | INTRAVENOUS | Status: AC | PRN
  Administered 2022-06-22: 10 mL via INTRAVENOUS

## 2022-06-23 IMAGING — MR MR LUMBAR SPINE WO/W CM
6 of 8 series · 22 of 48 positions shown · IV contrast (20 ml multihance)
Comparison: MRI 07/25/2006

CLINICAL DATA: Low back and left leg pain. Remote history of lumbar
surgery.

EXAM:
MRI LUMBAR SPINE WITHOUT AND WITH CONTRAST
TECHNIQUE: Multiplanar and multiecho pulse sequences of the lumbar spine were
obtained without and with intravenous contrast.
CONTRAST:  20mL MULTIHANCE GADOBENATE DIMEGLUMINE 529 MG/ML IV SOLN

[Series 9: T1 · sagittal · 4.0mm · 0.73mm/px · 4 of 15 slices shown]
[im 1/15]
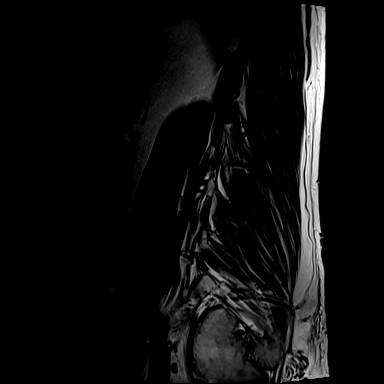
[im 5/15]
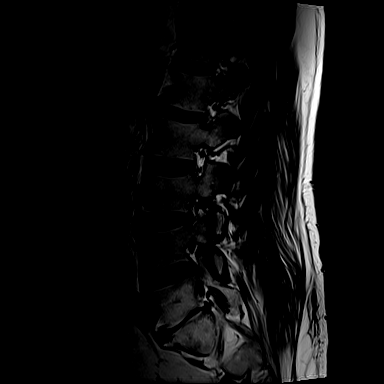
[im 10/15]
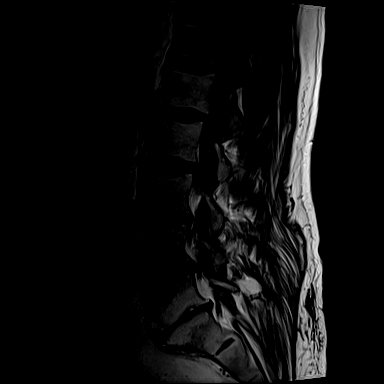
[im 15/15]
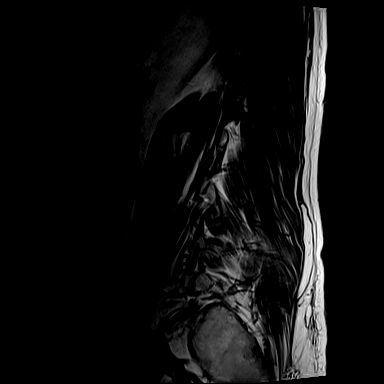

[Series 10: STIR · sagittal · 4.0mm · 0.88mm/px · 3 of 15 slices shown]
[im 1/15]
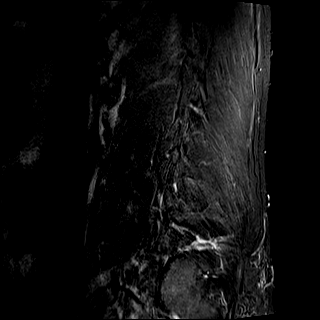
[im 8/15]
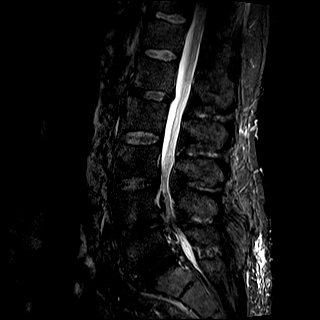
[im 15/15]
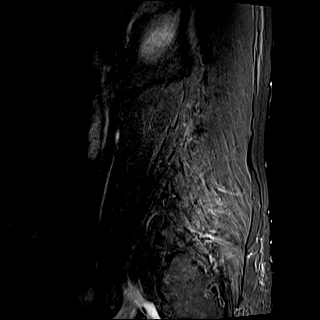

[Series 15: T2 · axial · 4.0mm · 0.28mm/px · z∈[-43,+192]mm · 8 of 45 slices shown (1 of 2)]
[im 1/45]
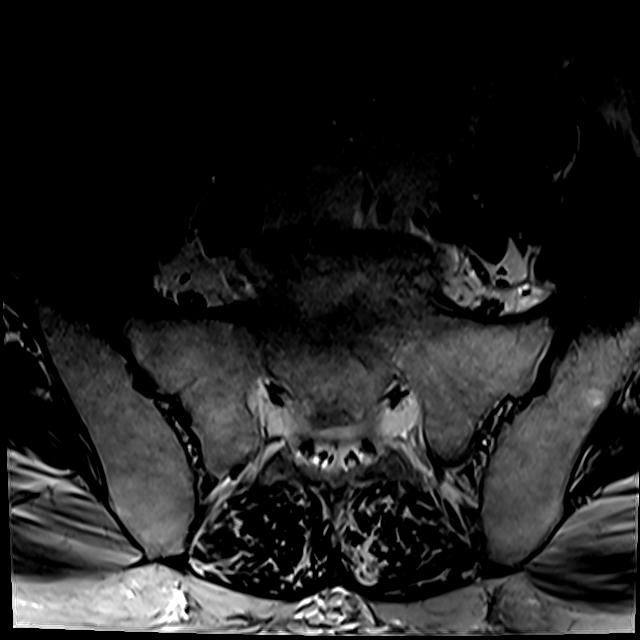
[im 5/45]
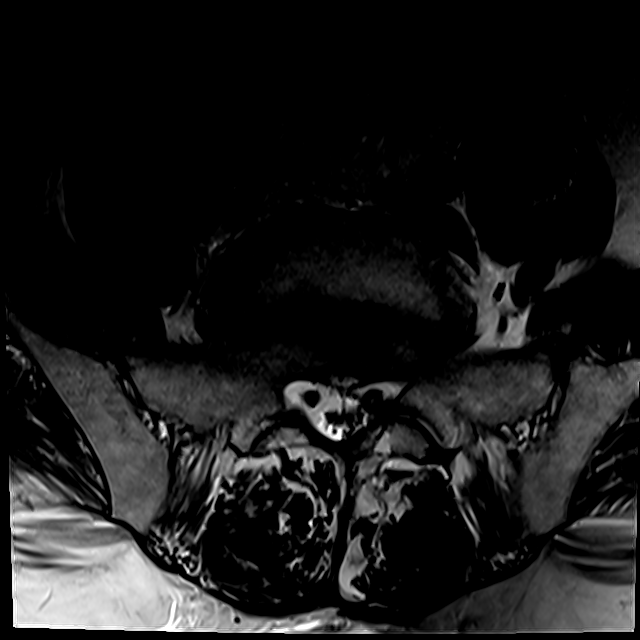
[im 15/45]
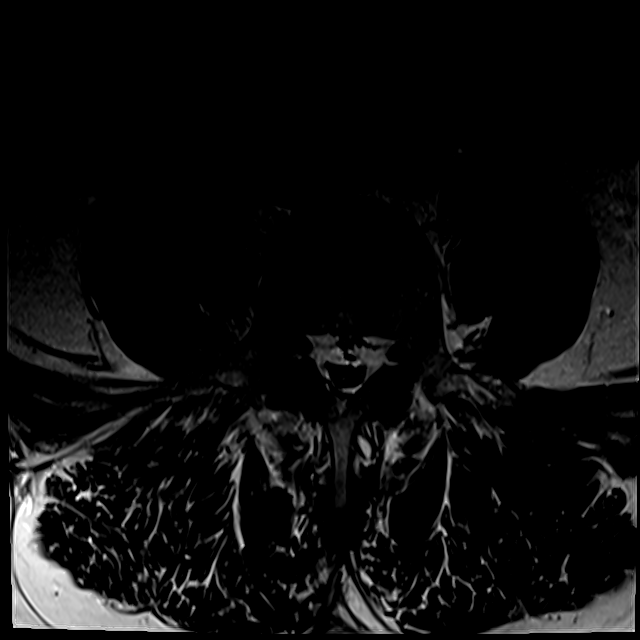
[im 20/45]
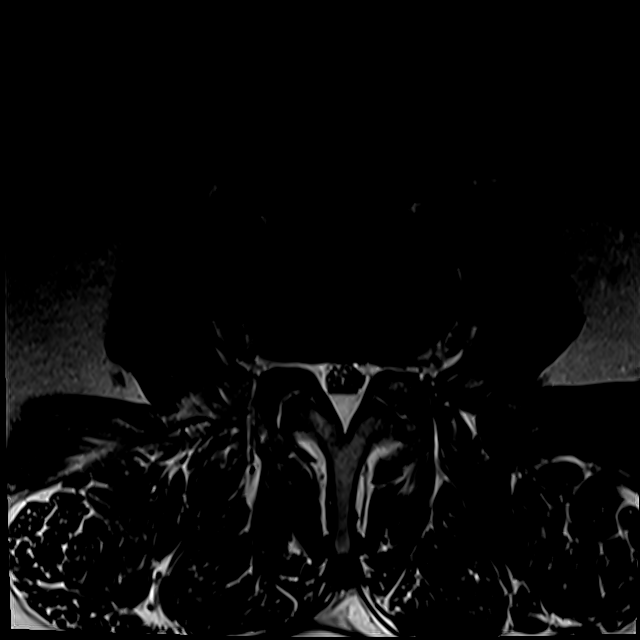
[im 25/45]
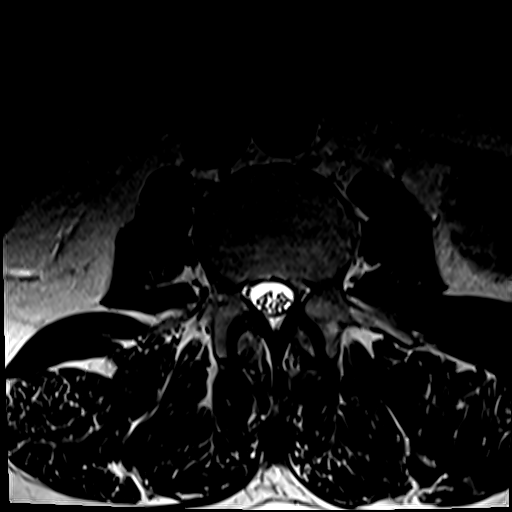
[im 30/45]
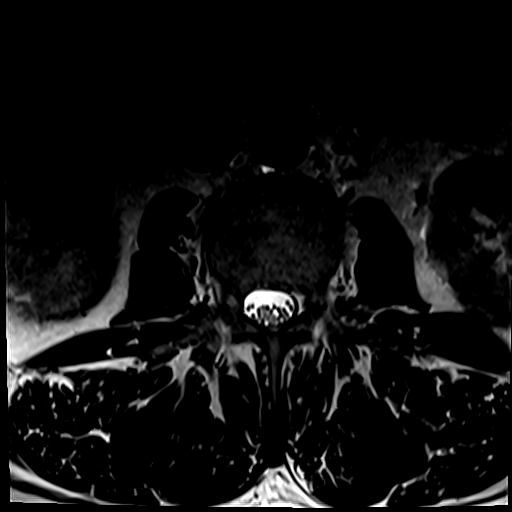
[im 40/45]
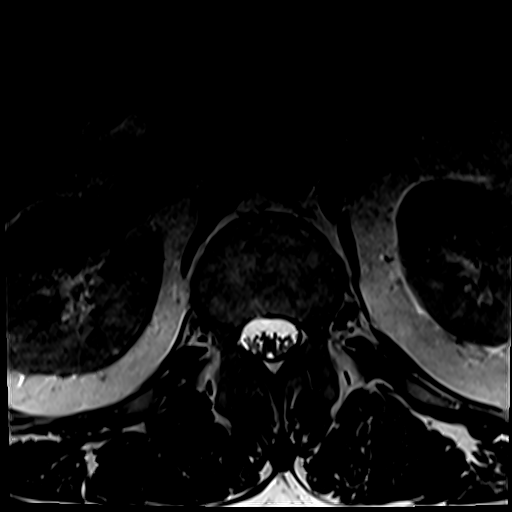
[im 45/45]
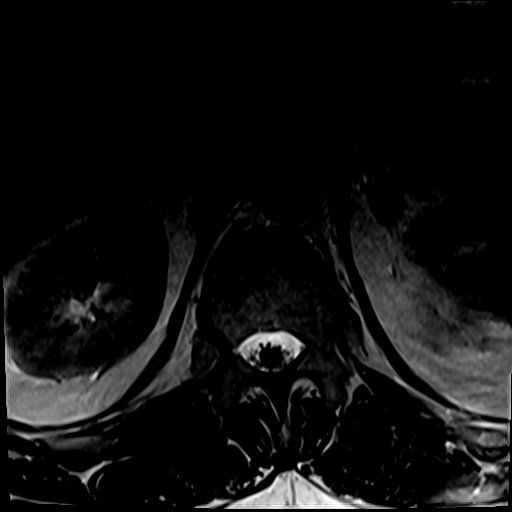

[Series 16: T2 · sagittal · 4.0mm · 0.73mm/px · 3 of 15 slices shown (2 of 2)]
[im 1/15]
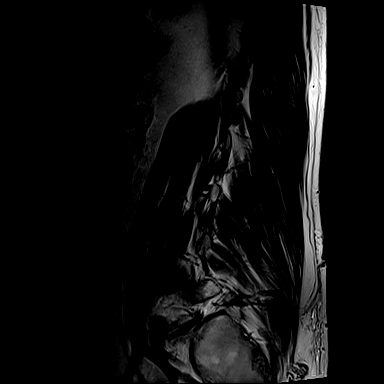
[im 8/15]
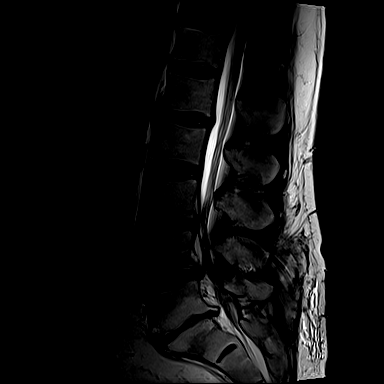
[im 15/15]
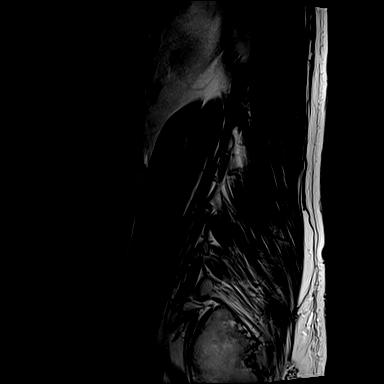

[Series 17: T1 fat-sat post-contrast · sagittal · 4.0mm · 0.73mm/px · 3 of 15 slices shown]
[im 1/15]
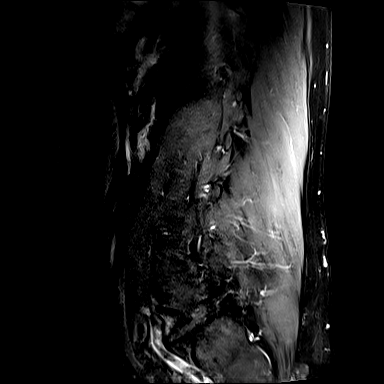
[im 8/15]
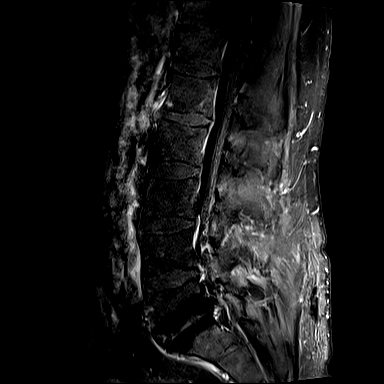
[im 15/15]
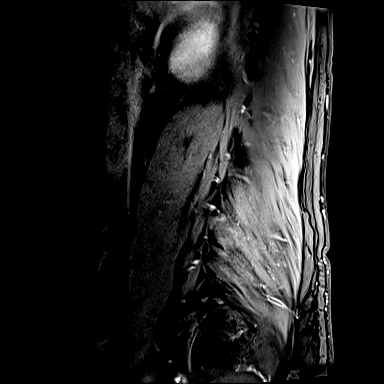

[Series 101: T1 post-contrast · axial · 4.0mm · 0.28mm/px · 1 of 22 slices shown]
[im 1/22]
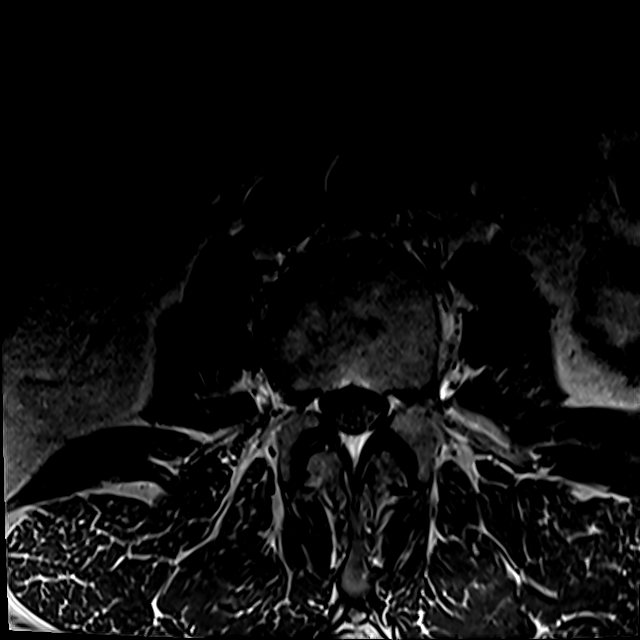

[22 of 48 positions shown; findings below may reference images not displayed]

FINDINGS: Segmentation: There are five lumbar type vertebral bodies. The last
full intervertebral disc space is labeled L5-S1. This correlates
with the prior MRI

Alignment:  Normal

Vertebrae: Marked fatty endplate reactive changes at L5-S1. No bone
lesions or fractures.

Conus medullaris and cauda equina: Conus extends to the L1 level.
Conus and cauda equina appear normal.

Paraspinal and other soft tissues: No significant paraspinal or
retroperitoneal findings.

Disc levels:

L1-2: No significant findings.

L2-3: No significant findings.

L3-4: Mild epidural lipomatosis but no disc protrusions, spinal or
foraminal stenosis. Mild facet disease.

L4-5: Small residual disc protrusion but significant retraction and
desiccation when compared to the prior study. This in combination
with moderate epidural lipomatosis, short pedicles and facet disease
contributes to mild to moderate spinal and bilateral lateral recess
stenosis. No foraminal stenosis.

L5-S1: Left-sided laminectomy defect. Marked disc space narrowing
and moderate osteophytic spurring posteriorly but no disc protrusion
or foraminal stenosis.
IMPRESSION: 1. Small residual disc protrusion at L4-5 but significant retraction
and desiccation when compared to the prior study. This in
combination with epidural lipomatosis, short pedicles and facet
disease contributes to mild to moderate spinal and bilateral lateral
recess stenosis.
2. Left-sided laminectomy defect at L5-S1 with advanced disc space
narrowing and moderate osteophytic spurring posteriorly but no disc
protrusion or foraminal stenosis.

## 2022-08-04 ENCOUNTER — Other Ambulatory Visit (INDEPENDENT_AMBULATORY_CARE_PROVIDER_SITE_OTHER): Payer: Self-pay | Admitting: Nurse Practitioner

## 2022-10-20 ENCOUNTER — Other Ambulatory Visit (INDEPENDENT_AMBULATORY_CARE_PROVIDER_SITE_OTHER): Payer: Self-pay | Admitting: Nurse Practitioner

## 2022-10-20 DIAGNOSIS — I6523 Occlusion and stenosis of bilateral carotid arteries: Secondary | ICD-10-CM

## 2022-10-20 DIAGNOSIS — Z9889 Other specified postprocedural states: Secondary | ICD-10-CM

## 2022-10-27 ENCOUNTER — Encounter (INDEPENDENT_AMBULATORY_CARE_PROVIDER_SITE_OTHER): Payer: 59

## 2022-10-27 ENCOUNTER — Ambulatory Visit (INDEPENDENT_AMBULATORY_CARE_PROVIDER_SITE_OTHER): Payer: 59 | Admitting: Vascular Surgery

## 2022-11-13 ENCOUNTER — Other Ambulatory Visit (INDEPENDENT_AMBULATORY_CARE_PROVIDER_SITE_OTHER): Payer: Self-pay | Admitting: Nurse Practitioner

## 2022-12-08 ENCOUNTER — Ambulatory Visit (INDEPENDENT_AMBULATORY_CARE_PROVIDER_SITE_OTHER): Payer: 59

## 2022-12-08 ENCOUNTER — Ambulatory Visit (INDEPENDENT_AMBULATORY_CARE_PROVIDER_SITE_OTHER): Payer: 59 | Admitting: Vascular Surgery

## 2022-12-08 DIAGNOSIS — I6523 Occlusion and stenosis of bilateral carotid arteries: Secondary | ICD-10-CM | POA: Diagnosis not present

## 2022-12-08 DIAGNOSIS — I739 Peripheral vascular disease, unspecified: Secondary | ICD-10-CM

## 2022-12-08 DIAGNOSIS — Z9889 Other specified postprocedural states: Secondary | ICD-10-CM

## 2022-12-11 LAB — VAS US ABI WITH/WO TBI
Left ABI: 0.68
Right ABI: 1.03

## 2022-12-26 ENCOUNTER — Encounter (INDEPENDENT_AMBULATORY_CARE_PROVIDER_SITE_OTHER): Payer: Self-pay | Admitting: Vascular Surgery

## 2022-12-26 ENCOUNTER — Ambulatory Visit (INDEPENDENT_AMBULATORY_CARE_PROVIDER_SITE_OTHER): Payer: 59 | Admitting: Vascular Surgery

## 2022-12-26 VITALS — BP 143/60 | HR 58 | Resp 16 | Wt 234.0 lb

## 2022-12-26 DIAGNOSIS — I6523 Occlusion and stenosis of bilateral carotid arteries: Secondary | ICD-10-CM | POA: Diagnosis not present

## 2022-12-26 DIAGNOSIS — E785 Hyperlipidemia, unspecified: Secondary | ICD-10-CM | POA: Diagnosis not present

## 2022-12-26 DIAGNOSIS — I10 Essential (primary) hypertension: Secondary | ICD-10-CM | POA: Diagnosis not present

## 2022-12-26 DIAGNOSIS — E1122 Type 2 diabetes mellitus with diabetic chronic kidney disease: Secondary | ICD-10-CM

## 2022-12-26 DIAGNOSIS — I70219 Atherosclerosis of native arteries of extremities with intermittent claudication, unspecified extremity: Secondary | ICD-10-CM

## 2022-12-26 DIAGNOSIS — N183 Chronic kidney disease, stage 3 unspecified: Secondary | ICD-10-CM

## 2022-12-26 NOTE — Assessment & Plan Note (Addendum)
Carotid duplex last month shows improvement in the velocities in the right carotid artery now measuring in the 1 to 39% range.  The left carotid stent is patent with velocities in the 1 to 39% range.  This is improved but I will still continue to follow it on 60-month intervals for now due to the previous concern for a mobile thrombus/plaque.  Continue current medical regimen which includes aspirin, Plavix, and Lipitor.

## 2022-12-26 NOTE — Assessment & Plan Note (Signed)
His ABIs were performed last month and showed a right ABI of 1.03 with brisk waveforms and a digit pressure of 152.  The left ABI was stable to slightly improved to 0.68 with a digit pressure of 102.  Symptoms are currently mild and not lifestyle limiting.  Continue dual antiplatelet therapy and statin agent.  Follow-up in 6 months with noninvasive studies.

## 2022-12-26 NOTE — Progress Notes (Signed)
MRN : [id number]  Charles Ibarra is a 57 y.o. (1965/03/07) male who presents with chief complaint of  Chief Complaint  Patient presents with   Follow-up    Ultrasound results  .  History of Present Illness: Patient returns today in follow up of multiple vascular issues.  He is about a year and a half status post left carotid stent placement for high-grade stenosis with amaurosis fugax.  He also underwent a carotid angiogram for concern of a mobile plaque/thrombus about a year ago.  He is doing well and denies any focal neurologic symptoms. Specifically, the patient denies amaurosis fugax, speech or swallowing difficulties, or arm or leg weakness or numbness. Carotid duplex last month shows improvement in the velocities in the right carotid artery now measuring in the 1 to 39% range.  The left carotid stent is patent with velocities in the 1 to 39% range.  He is also followed for peripheral arterial disease.  He has undergone right lower extremity revascularization in the past.  At his last visit, he had had a drop in his left ABI with claudication symptoms.  He elected to continue his dual antiplatelet therapy and statin agent and try to walk and exercise more to build collaterals.  This has worked largely for him clinically.  He is now able to walk a mile on the treadmill regularly without stopping.  He does notice claudication symptoms, but they are at a relatively long distance.  His ABIs were performed last month and showed a right ABI of 1.03 with brisk waveforms and a digit pressure of 152.  The left ABI was stable to slightly improved to 0.68 with a digit pressure of 102.   Current Outpatient Medications  Medication Sig Dispense Refill   aspirin EC 81 MG tablet Take 81 mg by mouth daily.     atorvastatin (LIPITOR) 80 MG tablet Take 80 mg by mouth at bedtime.     carvedilol (COREG) 12.5 MG tablet Take 12.5 mg by mouth 2 (two) times daily with a meal.     clopidogrel (PLAVIX) 75 MG  tablet Take 1 tablet by mouth once daily 90 tablet 0   colchicine 0.6 MG tablet Take 0.6 mg by mouth as needed.     EPINEPHrine 0.3 mg/0.3 mL IJ SOAJ injection      losartan (COZAAR) 100 MG tablet Take 100 mg by mouth daily.     nitroGLYCERIN (NITROSTAT) 0.4 MG SL tablet Place under the tongue.     torsemide (DEMADEX) 20 MG tablet Take 20 mg by mouth daily.     co-enzyme Q-10 30 MG capsule Take 40 mg by mouth daily. (Patient not taking: Reported on 12/26/2022)     levocetirizine (XYZAL) 5 MG tablet Take 5 mg by mouth every evening. (Patient not taking: Reported on 12/26/2022)     No current facility-administered medications for this visit.    Past Medical History:  Diagnosis Date   Alcohol abuse    Aortic stenosis    Aortic stenosis    Atherosclerotic PVD with intermittent claudication (HCC)    Chronic kidney disease, stage 3 (moderate)    Coronary artery disease involving native coronary artery of native heart with angina pectoris (HCC)    Diabetes mellitus without complication (HCC)    HTN (hypertension)    Hyperlipidemia    Ischemic cardiomyopathy    S/P CABG (coronary artery bypass graft)     Past Surgical History:  Procedure Laterality Date   BACK SURGERY  CAROTID ANGIOGRAPHY Right 12/26/2021   Procedure: CAROTID ANGIOGRAPHY;  Surgeon: Annice Needy, MD;  Location: [facility] INVASIVE CV LAB;  Service: Cardiovascular;  Laterality: Right;   CAROTID PTA/STENT INTERVENTION Left 02/10/2021   Procedure: CAROTID PTA/STENT INTERVENTION;  Surgeon: Annice Needy, MD;  Location: [facility] INVASIVE CV LAB;  Service: Cardiovascular;  Laterality: Left;   COLONOSCOPY     COLONOSCOPY N/A 03/09/2022   Procedure: COLONOSCOPY;  Surgeon: Jaynie Collins, DO;  Location: [facility] ENDOSCOPY;  Service: Gastroenterology;  Laterality: N/A;   CORONARY ARTERY BYPASS GRAFT     LOWER EXTREMITY ANGIOGRAPHY Right 07/11/2021   Procedure: Lower Extremity Angiography;  Surgeon: Annice Needy, MD;  Location: [facility] INVASIVE  CV LAB;  Service: Cardiovascular;  Laterality: Right;   NASAL FRACTURE SURGERY     TONSILLECTOMY     WISDOM TOOTH EXTRACTION       Social History   Tobacco Use   Smoking status: Former    Current packs/day: 0.00    Types: Cigarettes    Quit date: 04/02/2010    Years since quitting: 12.7   Smokeless tobacco: Never  Vaping Use   Vaping status: Never Used  Substance Use Topics   Alcohol use: Yes    Alcohol/week: 18.0 standard drinks of alcohol    Types: 18 Cans of beer per week    Comment: 6-7 beers every other day (natural light)   Drug use: Not Currently    Comment: CBD (smokable )     Family History No bleeding disorders, clotting disorders, or aneurysms  No Known Allergies   REVIEW OF SYSTEMS (Negative unless checked)   Constitutional: [] Weight loss  [] Fever  [] Chills Cardiac: [] Chest pain   [] Chest pressure   [] Palpitations   [] Shortness of breath when laying flat   [] Shortness of breath at rest   [x] Shortness of breath with exertion. Vascular:  [] Pain in legs with walking   [] Pain in legs at rest   [] Pain in legs when laying flat   [x] Claudication   [] Pain in feet when walking  [] Pain in feet at rest  [] Pain in feet when laying flat   [] History of DVT   [] Phlebitis   [] Swelling in legs   [] Varicose veins   [] Non-healing ulcers Pulmonary:   [] Uses home oxygen   [] Productive cough   [] Hemoptysis   [] Wheeze  [] COPD   [] Asthma Neurologic:  [] Dizziness  [] Blackouts   [] Seizures   [] History of stroke   [] History of TIA  [] Aphasia   [] Temporary blindness   [] Dysphagia   [] Weakness or numbness in arms   [] Weakness or numbness in legs Musculoskeletal:  [] Arthritis   [] Joint swelling   [] Joint pain   [] Low back pain Hematologic:  [] Easy bruising  [] Easy bleeding   [] Hypercoagulable state   [] Anemic  [] Hepatitis Gastrointestinal:  [] Blood in stool   [] Vomiting blood  [] Gastroesophageal reflux/heartburn   [] Abdominal pain Genitourinary:  [x] Chronic kidney disease   [] Difficult  urination  [] Frequent urination  [] Burning with urination   [] Hematuria Skin:  [] Rashes   [] Ulcers   [] Wounds Psychological:  [] History of anxiety   []  History of major depression.   Physical Examination  BP (!) 143/60 (BP Location: Left Arm)   Pulse (!) 58   Resp 16   Wt 234 lb (106.1 kg)   BMI 31.74 kg/m  Gen:  WD/WN, NAD Head: [place]/AT, No temporalis wasting. Ear/Nose/Throat: Hearing grossly intact, nares w/o erythema or drainage Eyes: Conjunctiva clear. Sclera non-icteric Neck: Supple.  Trachea midline. No carotid bruits.  Pulmonary:  Good air movement, no use of accessory muscles.  Cardiac: RRR, no JVD Vascular:  Vessel Right Left  Radial Palpable Palpable                          PT 2+ Palpable 1+ Palpable  DP 2+ Palpable 1+ Palpable   Gastrointestinal: soft, non-tender/non-distended. No guarding/reflex.  Musculoskeletal: M/S 5/5 throughout.  No deformity or atrophy. No edema. Neurologic: Sensation grossly intact in extremities.  Symmetrical.  Speech is fluent.  Psychiatric: Judgment intact, Mood & affect appropriate for pt's clinical situation. Dermatologic: No rashes or ulcers noted.  No cellulitis or open wounds.      Labs Recent Results (from the past 2160 hour(s))  VAS [place] ABI WITH/WO TBI     Status: None   Collection Time: 12/08/22  8:42 AM  Result Value Ref Range   Right ABI 1.03    Left ABI .68     Radiology VAS [place] LOWER EXTREMITY ARTERIAL DUPLEX  Result Date: 12/11/2022 LOWER EXTREMITY ARTERIAL DUPLEX STUDY Patient Name:  Charles Ibarra  Date of Exam:   12/08/2022 Medical Rec #: [id number]         Accession #:    [id number] Date of Birth: 1965/06/19        Patient Gender: M Patient Age:   35 years Exam Location:  [facility] Procedure:      VAS [place] LOWER EXTREMITY ARTERIAL DUPLEX Referring Phys: Sheppard Plumber --------------------------------------------------------------------------------  Indications: Claudication, and peripheral artery  disease. High Risk Factors: Hypertension, coronary artery disease. Other Factors: Patient reports left leg claudication symptoms are improving with                sustained exercise.  Vascular Interventions: 07/11/2021: Aortogram and Selctive Right Lower Extremity                         Angiogram. PTA of the Right SFA and above knee Popliteal                         Artery with 5 mm diameter Lutonix drug coated                         angioplasty balloon distally. Viabahn Stent placement x2                         with a pair of 6 mm diameter by 25 cm length stents in                         the Right SFA and Popliteal Artery. Current ABI:            Rt 1.03, Lt .68 Comparison Study: 04/28/2022 Performing Technologist: Debbe Bales RVS  Examination Guidelines: A complete evaluation includes B-mode imaging, spectral Doppler, color Doppler, and power Doppler as needed of all accessible portions of each vessel. Bilateral testing is considered an integral part of a complete examination. Limited examinations for reoccurring indications may be performed as noted.  +-----------+--------+-----+--------+---------+--------+ RIGHT      PSV cm/sRatioStenosisWaveform Comments +-----------+--------+-----+--------+---------+--------+ CFA Distal 135                  biphasic          +-----------+--------+-----+--------+---------+--------+ DFA        60  biphasic          +-----------+--------+-----+--------+---------+--------+ SFA Prox   93                   triphasic         +-----------+--------+-----+--------+---------+--------+ SFA Mid    79                   triphasic         +-----------+--------+-----+--------+---------+--------+ SFA Distal 53                   triphasic         +-----------+--------+-----+--------+---------+--------+ POP Distal 55                   triphasic         +-----------+--------+-----+--------+---------+--------+ ATA Distal 58                    triphasic         +-----------+--------+-----+--------+---------+--------+ PTA Distal 50                   triphasic         +-----------+--------+-----+--------+---------+--------+ PERO Distal37                   biphasic          +-----------+--------+-----+--------+---------+--------+  +-----------+--------+-----+--------+----------+--------+ LEFT       PSV cm/sRatioStenosisWaveform  Comments +-----------+--------+-----+--------+----------+--------+ CFA Distal 116                  triphasic          +-----------+--------+-----+--------+----------+--------+ DFA        41                   monophasic         +-----------+--------+-----+--------+----------+--------+ SFA Prox   26                   monophasic         +-----------+--------+-----+--------+----------+--------+ SFA Mid    113                  monophasic         +-----------+--------+-----+--------+----------+--------+ SFA Distal 15                   monophasic         +-----------+--------+-----+--------+----------+--------+ POP Distal 34                   monophasic         +-----------+--------+-----+--------+----------+--------+ ATA Distal 35                   monophasic         +-----------+--------+-----+--------+----------+--------+ PTA Distal 38                   monophasic         +-----------+--------+-----+--------+----------+--------+ PERO Distal45                   monophasic         +-----------+--------+-----+--------+----------+--------+  Summary: Right: Imaging and Waveforms obtained throughout in the Right Lower Extremity.Triphasic Waveforms seen predominantly in the Right Lower Extremity. Left: Imaging and Waveforms obtained throughout in the Left Lower Extremity. Monophasic Waveforms seen predominantly in the Left Lower Extremity.  See table(s) above for measurements and observations. Electronically signed by Festus Barren MD on  12/11/2022 at 9:09:35 AM.  Final    VAS US CAROTID  Result Date: 12/11/2022 Carotid Arterial Duplex Study Patient Name:  Charles Ibarra  Date of Exam:   12/08/2022 Medical Rec #: [id number]         Accession #:    [id number] Date of Birth: 1965-10-31        Patient Gender: M Patient Age:   40 years Exam Location:  [facility] Procedure:      VAS US CAROTID Referring Phys: Sheppard Plumber --------------------------------------------------------------------------------  Indications:       Carotid artery disease. Risk Factors:      Hypertension, coronary artery disease. Other Factors:     02/10/2021: Left carotid stent                     12/26/2021: Cervical and Cerebral Right Carotid Angiogram. Comparison Study:  04/28/2022 Performing Technologist: Debbe Bales RVS  Examination Guidelines: A complete evaluation includes B-mode imaging, spectral Doppler, color Doppler, and power Doppler as needed of all accessible portions of each vessel. Bilateral testing is considered an integral part of a complete examination. Limited examinations for reoccurring indications may be performed as noted.  Right Carotid Findings: +---------+--------+-------+--------+---------------------------------+--------+          PSV cm/sEDV    StenosisPlaque Description               Comments                  cm/s                                                     +---------+--------+-------+--------+---------------------------------+--------+ CCA Prox 82      13                                                       +---------+--------+-------+--------+---------------------------------+--------+ CCA Mid  85      19                                                       +---------+--------+-------+--------+---------------------------------+--------+ CCA      113     28             heterogenous, irregular and               Distal                          calcific                                   +---------+--------+-------+--------+---------------------------------+--------+ ICA Prox 97      36                                                       +---------+--------+-------+--------+---------------------------------+--------+  ICA Mid  114     28                                                       +---------+--------+-------+--------+---------------------------------+--------+ ICA      82      28                                                       Distal                                                                    +---------+--------+-------+--------+---------------------------------+--------+ ECA      134     14                                                       +---------+--------+-------+--------+---------------------------------+--------+ +----------+--------+-------+--------+-------------------+           PSV cm/sEDV cmsDescribeArm Pressure (mmHG) +----------+--------+-------+--------+-------------------+ Subclavian160     0                                  +----------+--------+-------+--------+-------------------+ +---------+--------+--+--------+--+ VertebralPSV cm/s53EDV cm/s17 +---------+--------+--+--------+--+  Left Carotid Findings: +---------+--------+-------+--------+---------------------------------+--------+          PSV cm/sEDV    StenosisPlaque Description               Comments                  cm/s                                                     +---------+--------+-------+--------+---------------------------------+--------+ CCA Prox 78      14             heterogenous, irregular and                                               calcific                                  +---------+--------+-------+--------+---------------------------------+--------+ CCA Mid  96      22             heterogenous, irregular and  calcific                                   +---------+--------+-------+--------+---------------------------------+--------+ CCA      69      19             calcific and smooth                       Distal                                                                    +---------+--------+-------+--------+---------------------------------+--------+ ICA Prox 69      22                                                       +---------+--------+-------+--------+---------------------------------+--------+ ICA Mid  87      31                                                       +---------+--------+-------+--------+---------------------------------+--------+ ICA      69      22                                                       Distal                                                                    +---------+--------+-------+--------+---------------------------------+--------+ ECA      359     45                                                       +---------+--------+-------+--------+---------------------------------+--------+ +----------+--------+--------+--------+-------------------+           PSV cm/sEDV cm/sDescribeArm Pressure (mmHG) +----------+--------+--------+--------+-------------------+ Subclavian182     0                                   +----------+--------+--------+--------+-------------------+ +---------+--------+--+--------+--+ VertebralPSV cm/s94EDV cm/s26 +---------+--------+--+--------+--+   Summary: Right Carotid: Velocities in the right ICA are consistent with a 1-39% stenosis. Left Carotid: Velocities in the left ICA are consistent with a 1-39% stenosis.               The ECA appears >50% stenosed. Vertebrals:  Bilateral vertebral arteries demonstrate antegrade flow. Subclavians: Normal flow hemodynamics were seen in bilateral subclavian              arteries. *See table(s) above for measurements and observations.  Electronically signed by  Festus Barren MD on 12/11/2022 at 9:09:26 AM.    Final    VAS [place] ABI WITH/WO TBI  Result Date: 12/11/2022  LOWER EXTREMITY DOPPLER STUDY Patient Name:  Charles Ibarra  Date of Exam:   12/08/2022 Medical Rec #: [id number]         Accession #:    [id number] Date of Birth: 1965/06/22        Patient Gender: M Patient Age:   57 years Exam Location:  [facility] Procedure:      VAS [place] ABI WITH/WO TBI Referring Phys: Festus Barren --------------------------------------------------------------------------------  Indications: Claudication, and peripheral artery disease. High Risk Factors: Hypertension, hyperlipidemia, coronary artery disease. Other Factors: Patient reports left leg claudication symptoms are improving with                sustained exercise.  Vascular Interventions: 07/11/2021: Aortogram and Selctive Right Lower Extremity                         Angiogram. PTA of the Right SFA and above knee Popliteal                         Artery with 5 mm diameter Lutonix drug coated                         angioplasty balloon distally. Viabahn Stent placement x2                         with a pair of 6 mm diameter by 25 cm length stents in                         the Right SFA and Popliteal Artery. Comparison Study: 04/28/2022 Performing Technologist: Debbe Bales RVS  Examination Guidelines: A complete evaluation includes at minimum, Doppler waveform signals and systolic blood pressure reading at the level of bilateral brachial, anterior tibial, and posterior tibial arteries, when vessel segments are accessible. Bilateral testing is considered an integral part of a complete examination. Photoelectric Plethysmograph (PPG) waveforms and toe systolic pressure readings are included as required and additional duplex testing as needed. Limited examinations for reoccurring indications may be performed as noted.  ABI Findings: +---------+------------------+-----+---------+--------+ Right    Rt Pressure  (mmHg)IndexWaveform Comment  +---------+------------------+-----+---------+--------+ Brachial 148                                      +---------+------------------+-----+---------+--------+ ATA      156               1.03 triphasic         +---------+------------------+-----+---------+--------+ PTA      150               0.99 triphasic         +---------+------------------+-----+---------+--------+ Great Toe152               1.01 Normal            +---------+------------------+-----+---------+--------+ +---------+------------------+-----+----------+-------+ Left     Lt Pressure (  mmHg)IndexWaveform  Comment +---------+------------------+-----+----------+-------+ Brachial 151                                      +---------+------------------+-----+----------+-------+ ATA      102               0.68 monophasic        +---------+------------------+-----+----------+-------+ PTA      87                0.58 monophasic        +---------+------------------+-----+----------+-------+ Great Toe102               0.68 Normal            +---------+------------------+-----+----------+-------+ +-------+-----------+-----------+------------+------------+ ABI/TBIToday's ABIToday's TBIPrevious ABIPrevious TBI +-------+-----------+-----------+------------+------------+ Right  1.03       1.01       1.04        .91          +-------+-----------+-----------+------------+------------+ Left   .68        .68        .63         .33          +-------+-----------+-----------+------------+------------+ Bilateral TBIs appear increased compared to prior study on 04/28/2022. Right ABIs appear essentially unchanged compared to prior study on 04/28/2022. Left ABIs appear to be increased compared to prior study on 05/27/2022.  Summary: Right: Resting right ankle-brachial index is within normal range. The right toe-brachial index is normal. Left: Resting left ankle-brachial  index indicates moderate left lower extremity arterial disease. The left toe-brachial index is normal. *See table(s) above for measurements and observations.  Electronically signed by Festus Barren MD on 12/11/2022 at 9:09:18 AM.    Final     Assessment/Plan  Carotid artery disease (HCC) Carotid duplex last month shows improvement in the velocities in the right carotid artery now measuring in the 1 to 39% range.  The left carotid stent is patent with velocities in the 1 to 39% range.  This is improved but I will still continue to follow it on 32-month intervals for now due to the previous concern for a mobile thrombus/plaque.  Continue current medical regimen which includes aspirin, Plavix, and Lipitor.  Atherosclerotic PVD with intermittent claudication (HCC) His ABIs were performed last month and showed a right ABI of 1.03 with brisk waveforms and a digit pressure of 152.  The left ABI was stable to slightly improved to 0.68 with a digit pressure of 102.  Symptoms are currently mild and not lifestyle limiting.  Continue dual antiplatelet therapy and statin agent.  Follow-up in 6 months with noninvasive studies.  Hyperlipidemia lipid control important in reducing the progression of atherosclerotic disease. Continue statin therapy     Essential hypertension blood pressure control important in reducing the progression of atherosclerotic disease. On appropriate oral medications.     Diabetes (HCC) blood glucose control important in reducing the progression of atherosclerotic disease. Also, involved in wound healing. On appropriate medications.  Festus Barren, MD  12/26/2022 4:02 PM    This note was created with Dragon medical transcription system.  Any errors from dictation are purely unintentional

## 2023-02-01 ENCOUNTER — Other Ambulatory Visit (INDEPENDENT_AMBULATORY_CARE_PROVIDER_SITE_OTHER): Payer: Self-pay | Admitting: Nurse Practitioner

## 2023-05-30 ENCOUNTER — Encounter: Admission: RE | Disposition: A | Discharge status: Home / Self Care | Payer: Self-pay | Source: Home / Self Care

## 2023-05-30 ENCOUNTER — Ambulatory Visit: Admission: RE | Admit: 2023-05-30 | Discharge: 2023-05-30 | Disposition: A | Discharge status: Home / Self Care

## 2023-05-30 DIAGNOSIS — N183 Chronic kidney disease, stage 3 unspecified: Secondary | ICD-10-CM | POA: Insufficient documentation

## 2023-05-30 DIAGNOSIS — Z955 Presence of coronary angioplasty implant and graft: Secondary | ICD-10-CM | POA: Insufficient documentation

## 2023-05-30 DIAGNOSIS — Z951 Presence of aortocoronary bypass graft: Secondary | ICD-10-CM | POA: Insufficient documentation

## 2023-05-30 DIAGNOSIS — I2510 Atherosclerotic heart disease of native coronary artery without angina pectoris: Secondary | ICD-10-CM | POA: Diagnosis not present

## 2023-05-30 DIAGNOSIS — E1122 Type 2 diabetes mellitus with diabetic chronic kidney disease: Secondary | ICD-10-CM | POA: Insufficient documentation

## 2023-05-30 DIAGNOSIS — I350 Nonrheumatic aortic (valve) stenosis: Secondary | ICD-10-CM | POA: Diagnosis not present

## 2023-05-30 DIAGNOSIS — I255 Ischemic cardiomyopathy: Secondary | ICD-10-CM | POA: Insufficient documentation

## 2023-05-30 DIAGNOSIS — Z79899 Other long term (current) drug therapy: Secondary | ICD-10-CM | POA: Insufficient documentation

## 2023-05-30 DIAGNOSIS — Z87891 Personal history of nicotine dependence: Secondary | ICD-10-CM | POA: Diagnosis not present

## 2023-05-30 DIAGNOSIS — I129 Hypertensive chronic kidney disease with stage 1 through stage 4 chronic kidney disease, or unspecified chronic kidney disease: Secondary | ICD-10-CM | POA: Diagnosis not present

## 2023-05-30 DIAGNOSIS — R9439 Abnormal result of other cardiovascular function study: Secondary | ICD-10-CM | POA: Insufficient documentation

## 2023-05-30 HISTORY — PX: LEFT HEART CATH AND CORS/GRAFTS ANGIOGRAPHY: CATH118250

## 2023-05-30 SURGERY — LEFT HEART CATH AND CORS/GRAFTS ANGIOGRAPHY
Anesthesia: Moderate Sedation | Laterality: Left

## 2023-05-30 MED ORDER — IOHEXOL 300 MG/ML  SOLN
INTRAMUSCULAR | Status: DC | PRN
  Administered 2023-05-30: 118 mL

## 2023-05-30 MED ORDER — MIDAZOLAM HCL 2 MG/2ML IJ SOLN
INTRAMUSCULAR | Status: AC
  Filled 2023-05-30: qty 2

## 2023-05-30 MED ORDER — LIDOCAINE HCL 1 % IJ SOLN
INTRAMUSCULAR | Status: AC
  Filled 2023-05-30: qty 20

## 2023-05-30 MED ORDER — VERAPAMIL HCL 2.5 MG/ML IV SOLN
INTRAVENOUS | Status: DC | PRN
  Administered 2023-05-30: 2.5 mL via INTRA_ARTERIAL

## 2023-05-30 MED ORDER — HEPARIN SODIUM (PORCINE) 1000 UNIT/ML IJ SOLN
INTRAMUSCULAR | Status: DC | PRN
  Administered 2023-05-30: 5000 [IU] via INTRAVENOUS

## 2023-05-30 MED ORDER — HEPARIN SODIUM (PORCINE) 1000 UNIT/ML IJ SOLN
INTRAMUSCULAR | Status: AC
  Filled 2023-05-30: qty 10

## 2023-05-30 MED ORDER — SODIUM CHLORIDE 0.9% FLUSH: 3.0000 mL | INTRAVENOUS | Status: DC | PRN

## 2023-05-30 MED ORDER — LIDOCAINE HCL (PF) 1 % IJ SOLN
INTRAMUSCULAR | Status: DC | PRN
  Administered 2023-05-30: 2 mL

## 2023-05-30 MED ORDER — FENTANYL CITRATE (PF) 100 MCG/2ML IJ SOLN
INTRAMUSCULAR | Status: DC | PRN
  Administered 2023-05-30 (×2): 50 ug via INTRAVENOUS

## 2023-05-30 MED ORDER — SODIUM CHLORIDE 0.9 % IV SOLN: INTRAVENOUS | Status: DC

## 2023-05-30 MED ORDER — FENTANYL CITRATE (PF) 100 MCG/2ML IJ SOLN
INTRAMUSCULAR | Status: AC
  Filled 2023-05-30: qty 2

## 2023-05-30 MED ORDER — HEPARIN (PORCINE) IN NACL 1000-0.9 UT/500ML-% IV SOLN
INTRAVENOUS | Status: AC
  Filled 2023-05-30: qty 1000

## 2023-05-30 MED ORDER — MIDAZOLAM HCL 2 MG/2ML IJ SOLN
INTRAMUSCULAR | Status: DC | PRN
  Administered 2023-05-30 (×2): 1 mg via INTRAVENOUS

## 2023-05-30 MED ORDER — HEPARIN (PORCINE) IN NACL 1000-0.9 UT/500ML-% IV SOLN
INTRAVENOUS | Status: DC | PRN
  Administered 2023-05-30: 1000 mL

## 2023-05-30 MED ORDER — ASPIRIN 81 MG PO CHEW: 81.0000 mg | CHEWABLE_TABLET | ORAL | Status: DC

## 2023-05-30 MED ORDER — SODIUM CHLORIDE 0.9 % IV SOLN: 250.0000 mL | INTRAVENOUS | Status: DC | PRN

## 2023-05-30 MED ORDER — VERAPAMIL HCL 2.5 MG/ML IV SOLN
INTRAVENOUS | Status: AC
  Filled 2023-05-30: qty 2

## 2023-05-30 MED ORDER — SODIUM CHLORIDE 0.9% FLUSH: 3.0000 mL | Freq: Two times a day (BID) | INTRAVENOUS | Status: DC

## 2023-05-30 SURGICAL SUPPLY — 11 items
CATH INFINITI 5 FR IM (CATHETERS) IMPLANT
CATH INFINITI 5 FR JL3.5 (CATHETERS) IMPLANT
CATH INFINITI 5FR JL4 (CATHETERS) IMPLANT
CATH INFINITI JR4 5F (CATHETERS) IMPLANT
DEVICE RAD TR BAND REGULAR (VASCULAR PRODUCTS) IMPLANT
DRAPE BRACHIAL (DRAPES) IMPLANT
GLIDESHEATH SLEND A-KIT 6F 22G (SHEATH) IMPLANT
GUIDEWIRE INQWIRE 1.5J.035X260 (WIRE) IMPLANT
PACK CARDIAC CATH (CUSTOM PROCEDURE TRAY) ×1 IMPLANT
SET ATX-X65L (MISCELLANEOUS) IMPLANT
STATION PROTECTION PRESSURIZED (MISCELLANEOUS) IMPLANT

## 2023-05-30 NOTE — Progress Notes (Signed)
 Dr. Estill Hemming at bedside, speaking with pt. And his aunt re: cath results. Both pt. And aunt verbalized understanding of conversation.

## 2023-06-08 ENCOUNTER — Emergency Department
Admission: EM | Admit: 2023-06-08 | Discharge: 2023-06-08 | Disposition: A | Discharge status: Home / Self Care | Attending: Emergency Medicine | Admitting: Emergency Medicine

## 2023-06-08 ENCOUNTER — Other Ambulatory Visit: Payer: Self-pay

## 2023-06-08 ENCOUNTER — Emergency Department

## 2023-06-08 DIAGNOSIS — Z7982 Long term (current) use of aspirin: Secondary | ICD-10-CM | POA: Diagnosis not present

## 2023-06-08 DIAGNOSIS — Z955 Presence of coronary angioplasty implant and graft: Secondary | ICD-10-CM | POA: Insufficient documentation

## 2023-06-08 DIAGNOSIS — I200 Unstable angina: Secondary | ICD-10-CM | POA: Diagnosis not present

## 2023-06-08 DIAGNOSIS — R079 Chest pain, unspecified: Secondary | ICD-10-CM | POA: Diagnosis present

## 2023-06-08 DIAGNOSIS — R0789 Other chest pain: Secondary | ICD-10-CM

## 2023-06-08 LAB — CBC
HCT: 35.5 % — ABNORMAL LOW (ref 39.0–52.0)
Hemoglobin: 12.4 g/dL — ABNORMAL LOW (ref 13.0–17.0)
MCH: 32.5 pg (ref 26.0–34.0)
MCHC: 34.9 g/dL (ref 30.0–36.0)
MCV: 93.2 fL (ref 80.0–100.0)
Platelets: 154 10*3/uL (ref 150–400)
RBC: 3.81 MIL/uL — ABNORMAL LOW (ref 4.22–5.81)
RDW: 12.5 % (ref 11.5–15.5)
WBC: 5.1 10*3/uL (ref 4.0–10.5)
nRBC: 0 % (ref 0.0–0.2)

## 2023-06-08 LAB — BASIC METABOLIC PANEL WITH GFR
Anion gap: 6 (ref 5–15)
BUN: 17 mg/dL (ref 6–20)
CO2: 25 mmol/L (ref 22–32)
Calcium: 9 mg/dL (ref 8.9–10.3)
Chloride: 104 mmol/L (ref 98–111)
Creatinine, Ser: 1.25 mg/dL — ABNORMAL HIGH (ref 0.61–1.24)
GFR, Estimated: >60 mL/min (ref >60)
Glucose, Bld: 204 mg/dL — ABNORMAL HIGH (ref 70–99)
Potassium: 4.2 mmol/L (ref 3.5–5.1)
Sodium: 135 mmol/L (ref 135–145)

## 2023-06-08 LAB — TROPONIN I (HIGH SENSITIVITY)
Troponin I (High Sensitivity): 19 ng/L — ABNORMAL HIGH (ref <18)
Troponin I (High Sensitivity): 260 ng/L (ref <18)

## 2023-06-08 LAB — PROTIME-INR
INR: 1 (ref 0.8–1.2)
Prothrombin Time: 12.8 s (ref 11.4–15.2)

## 2023-06-08 MED ORDER — NITROGLYCERIN 0.4 MG SL SUBL
0.4000 mg | SUBLINGUAL_TABLET | SUBLINGUAL | Status: DC | PRN
  Administered 2023-06-08: 0.4 mg via SUBLINGUAL
  Filled 2023-06-08: qty 1

## 2023-06-08 MED ORDER — ASPIRIN 81 MG PO CHEW
243.0000 mg | CHEWABLE_TABLET | Freq: Once | ORAL | Status: AC
Start: 2023-06-08 — End: 2023-06-08
  Administered 2023-06-08: 243 mg via ORAL
  Filled 2023-06-08: qty 3

## 2023-06-08 MED ORDER — NITROGLYCERIN 0.3 MG SL SUBL: 0.3000 mg | SUBLINGUAL_TABLET | SUBLINGUAL | 3 refills | Status: AC | PRN

## 2023-06-08 NOTE — ED Provider Notes (Signed)
 [facility] Provider Note    Event Date/Time   First MD Initiated Contact with Patient 06/08/23 [phone]     (approximate)   History   Chest Pain   HPI  Charles Ibarra is a 58 y.o. male who presents to the ED for evaluation of Chest Pain   Review a [facility] cardiology clinic visit from 4/24.  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  PAD with lower extremity revascularization, left ICA stenting..  Aspirin  and Plavix  without AC. Outpatient left heart cath on 5/7, proximal LAD with 90% stenosis, planned high risk PCI to this proximal LAD lesion   Patient presents after an episode of chest pain that occurred this morning.  He was in his truck elevated work, 15 minutes of pressure similar to his previous heart attack.  Since improved dramatically, reports some residual, mild 2/10 intensity pressure on arrival to the ED prior to nitro administration when it then resolves.  No recurrence.  Only 1 episode.   Physical Exam   Triage Vital Signs: ED Triage Vitals  Encounter Vitals Group     BP 06/08/23 0834 (!) 170/85     Systolic BP Percentile --      Diastolic BP Percentile --      Pulse Rate 06/08/23 0830 (!) 57     Resp 06/08/23 0830 14     Temp 06/08/23 0830 98.2 F (36.8 C)     Temp Source 06/08/23 0830 Oral     SpO2 06/08/23 0830 98 %     Weight 06/08/23 0829 225 lb (102.1 kg)     Height 06/08/23 0829 6' (1.829 m)     Head Circumference --      Peak Flow --      Pain Score 06/08/23 0828 0     Pain Loc --      Pain Education --      Exclude from Growth Chart --     Most recent vital signs: Vitals:   06/08/23 0834 06/08/23 0900  BP: (!) 170/85 135/84  Pulse:  (!) 51  Resp:  17  Temp:    SpO2:  96%    General: Awake, no distress.  CV:  Good peripheral perfusion.  Resp:  Normal effort.  Abd:  No distention.  MSK:  No deformity noted.  Neuro:  No focal deficits appreciated. Other:     ED Results / Procedures /  Treatments   Labs (all labs ordered are listed, but only abnormal results are displayed) Labs Reviewed  BASIC METABOLIC PANEL WITH GFR - Abnormal; Notable for the following components:      Result Value   Glucose, Bld 204 (*)    Creatinine, Ser 1.25 (*)    All other components within normal limits  CBC - Abnormal; Notable for the following components:   RBC 3.81 (*)    Hemoglobin 12.4 (*)    HCT 35.5 (*)    All other components within normal limits  TROPONIN I (HIGH SENSITIVITY) - Abnormal; Notable for the following components:   Troponin I (High Sensitivity) 19 (*)    All other components within normal limits  TROPONIN I (HIGH SENSITIVITY) - Abnormal; Notable for the following components:   Troponin I (High Sensitivity) 260 (*)    All other components within normal limits  PROTIME-INR    EKG Sinus rhythm at a rate of 57 bpm.  Borderline prolonged PR interval without high-grade block.  Leftward axis.  No STEMI.  RADIOLOGY CXR interpreted by me without evidence of acute cardiopulmonary pathology.  Official radiology report(s): DG Chest Port 1 View Result Date: 06/08/2023 CLINICAL DATA:  Chest pain. EXAM: PORTABLE CHEST 1 VIEW COMPARISON:  Chest radiograph dated 06/19/2013. FINDINGS: No focal consolidation, pleural effusion or pneumothorax. Stable cardiac silhouette. Median sternotomy wires. No acute osseous pathology. IMPRESSION: No active disease. Electronically Signed   By: Angus Bark M.D.   On: 06/08/2023 09:13    PROCEDURES and INTERVENTIONS:  .1-3 Lead EKG Interpretation  Performed by: Arline Bennett, MD Authorized by: Arline Bennett, MD     Interpretation: normal     ECG rate:  56   ECG rate assessment: bradycardic     Rhythm: sinus bradycardia     Ectopy: none     Conduction: normal   .Critical Care  Performed by: Arline Bennett, MD Authorized by: Arline Bennett, MD   Critical care provider statement:    Critical care time (minutes):  30   Critical care time  was exclusive of:  Separately billable procedures and treating other patients   Critical care was necessary to treat or prevent imminent or life-threatening deterioration of the following conditions:  Cardiac failure and circulatory failure   Critical care was time spent personally by me on the following activities:  Development of treatment plan with patient or surrogate, discussions with consultants, evaluation of patient's response to treatment, examination of patient, ordering and review of laboratory studies, ordering and review of radiographic studies, ordering and performing treatments and interventions, pulse oximetry, re-evaluation of patient's condition and review of old charts   Medications  nitroGLYCERIN  (NITROSTAT ) SL tablet 0.4 mg (0.4 mg Sublingual Given 06/08/23 0900)  aspirin  chewable tablet 243 mg (243 mg Oral Given 06/08/23 0859)     IMPRESSION / MDM / ASSESSMENT AND PLAN / ED COURSE  I reviewed the triage vital signs and the nursing notes.  Differential diagnosis includes, but is not limited to, ACS, PTX, PNA, muscle strain/spasm, PE, dissection, anxiety, pleural effusion  {Patient presents with symptoms of an acute illness or injury that is potentially life-threatening.  Patient with known complicated proximal LAD lesion presents after resolving episode of chest pain with signs of an NSTEMI but, as below, suitable for outpatient management with close cardiology follow-up with planned outpatient complex PCI on Tuesday at [facility].  Residual mild discomfort on arrival, resolves with nitroglycerin  and that does not recur.  First troponin is low and second troponin increases dramatically to 260.  Otherwise CBC and BMP at baseline.  Clear CXR.  EKG remains nonischemic.  Consult cardiology who recommends outpatient management with nitroglycerin  and close follow-up.  I discussed close ED return precautions with the patient if he has recurrence of pain.  Discharged with refill of his  nitroglycerin  tablets  Clinical Course as of 06/08/23 1522  Fri Jun 08, 2023  1610 Reassessed.  Feeling well and pain-free.  We discussed workup so far and plan of care.  He is agreeable. [DS]  1222 Second troponin noted.  Paged cardiology [DS]  1225 I consult with Dr. Custovic, she is aware of this patient.  Despite elevated troponins, considering he is pain-free, well-appearing and no recurrence of symptoms she is comfortable sending him home.  Assuring he has nitroglycerin  as needed available at home.  There is nothing that we can do for this patient here at [facility], due to his complex anatomy procedure must be done at [facility] and unless he is a STEMI he will be okay to go home. [  DS]  1231 Updated patient of this, nitroglycerin , expectant management, return precautions.  He is agreeable.  He remains pain-free and well-appearing [DS]    Clinical Course User Index [DS] Arline Bennett, MD     FINAL CLINICAL IMPRESSION(S) / ED DIAGNOSES   Final diagnoses:  Other chest pain  Unstable angina (HCC)     Rx / DC Orders   ED Discharge Orders          Ordered    nitroGLYCERIN  (NITROSTAT ) 0.3 MG SL tablet  Every 5 min PRN        06/08/23 1232             Note:  This document was prepared using Dragon voice recognition software and may include unintentional dictation errors.   Arline Bennett, MD 06/08/23 [phone]

## 2023-06-08 NOTE — ED Triage Notes (Signed)
 Pt in via [facility] coming from work where he started to have Cp in center of chest and radiating down left arm. Per EMs arrival CP went away. Previous hx of an MI and scheduled for a stent at [facility] on Tuesday, Pt had a cath done last week. Pt brady with EMS. Pt has taken daily aspirin  of 81mg . EMS did not give any meds due to Cp resolution. Hx of CHf and High BP. Pt is on blood thinners.  BP- initally 184/90 last 163/80  HR-54  RA 100%

## 2023-06-08 NOTE — Discharge Instructions (Addendum)
 Make sure to pick up the nitroglycerin  prescription at the pharmacy and keep this medicine with you at all times.  It is important to try to make an until Tuesday for your catheterization but if things get worse in the meantime return to the ED.

## 2023-06-12 ENCOUNTER — Encounter (INDEPENDENT_AMBULATORY_CARE_PROVIDER_SITE_OTHER): Payer: Self-pay

## 2023-06-22 ENCOUNTER — Encounter: Attending: Cardiology | Admitting: *Deleted

## 2023-06-22 DIAGNOSIS — Z955 Presence of coronary angioplasty implant and graft: Secondary | ICD-10-CM

## 2023-06-22 NOTE — Progress Notes (Signed)
 Initial phone call completed. Diagnosis can be found in [facility] 5/20. EP Orientation scheduled for Thursday 6/5 at 2pm.

## 2023-06-28 ENCOUNTER — Encounter: Attending: Cardiology

## 2023-06-28 VITALS — Ht 71.46 in | Wt 228.5 lb

## 2023-06-28 DIAGNOSIS — Z955 Presence of coronary angioplasty implant and graft: Secondary | ICD-10-CM | POA: Diagnosis present

## 2023-06-28 DIAGNOSIS — Z48812 Encounter for surgical aftercare following surgery on the circulatory system: Secondary | ICD-10-CM | POA: Diagnosis present

## 2023-06-28 NOTE — Progress Notes (Signed)
 Cardiac Individual Treatment Plan  Patient Details  Name: Charles Ibarra MRN: [id number] Date of Birth: 1965-04-13 Referring Provider:   Flowsheet Row Cardiac Rehab from 06/28/2023 in [facility] Cardiac and Pulmonary Rehab  Referring Provider Percival Brace, MD       Initial Encounter Date:  Flowsheet Row Cardiac Rehab from 06/28/2023 in [facility] Cardiac and Pulmonary Rehab  Date 06/28/23       Visit Diagnosis: Status post coronary artery stent placement  Patient's Home Medications on Admission:  Current Outpatient Medications:    aspirin  EC 81 MG tablet, Take 81 mg by mouth daily., Disp: , Rfl:    atorvastatin  (LIPITOR) 80 MG tablet, Take 80 mg by mouth at bedtime., Disp: , Rfl:    carvedilol  (COREG ) 25 MG tablet, Take 12.5 mg by mouth 2 (two) times daily with a meal., Disp: , Rfl:    clopidogrel  (PLAVIX ) 75 MG tablet, Take 1 tablet by mouth once daily, Disp: 90 tablet, Rfl: 1   colchicine 0.6 MG tablet, Take 0.6 mg by mouth daily as needed (Gout). (Patient not taking: Reported on 06/22/2023), Disp: , Rfl:    cyanocobalamin (VITAMIN B12) 1000 MCG tablet, Take 1,000 mcg by mouth daily. (Patient not taking: Reported on 06/22/2023), Disp: , Rfl:    ezetimibe (ZETIA) 10 MG tablet, Take 10 mg by mouth daily., Disp: , Rfl:    losartan  (COZAAR ) 100 MG tablet, Take 100 mg by mouth daily., Disp: , Rfl:    nitroGLYCERIN  (NITROSTAT ) 0.3 MG SL tablet, Place 1 tablet (0.3 mg total) under the tongue every 5 (five) minutes as needed for chest pain., Disp: 100 tablet, Rfl: 3   tadalafil (CIALIS) 20 MG tablet, Take 20 mg by mouth daily as needed for erectile dysfunction., Disp: , Rfl:    torsemide  (DEMADEX ) 20 MG tablet, Take 20 mg by mouth daily., Disp: , Rfl:   Past Medical History: Past Medical History:  Diagnosis Date   Alcohol abuse    Aortic stenosis    Aortic stenosis    Atherosclerotic PVD with intermittent claudication (HCC)    Chronic kidney disease, stage 3 (moderate)    Coronary  artery disease involving native coronary artery of native heart with angina pectoris (HCC)    Diabetes mellitus without complication (HCC)    HTN (hypertension)    Hyperlipidemia    Ischemic cardiomyopathy    S/P CABG (coronary artery bypass graft)     Tobacco Use: Social History   Tobacco Use  Smoking Status Former   Current packs/day: 0.00   Types: Cigarettes   Quit date: 04/02/2010   Years since quitting: 13.2  Smokeless Tobacco Never    Labs: Review Flowsheet        No data to display           Exercise Target Goals: Exercise Program Goal: Individual exercise prescription set using results from initial 6 min walk test and THRR while considering  patient's activity barriers and safety.   Exercise Prescription Goal: Initial exercise prescription builds to 30-45 minutes a day of aerobic activity, 2-3 days per week.  Home exercise guidelines will be given to patient during program as part of exercise prescription that the participant will acknowledge.   Education: Aerobic Exercise: - Group verbal and visual presentation on the components of exercise prescription. Introduces F.I.T.T principle from ACSM for exercise prescriptions.  Reviews F.I.T.T. principles of aerobic exercise including progression. Written material given at graduation. Flowsheet Row Cardiac Rehab from 06/28/2023 in [facility] Cardiac and Pulmonary Rehab  Education need identified 06/28/23       Education: Resistance Exercise: - Group verbal and visual presentation on the components of exercise prescription. Introduces F.I.T.T principle from ACSM for exercise prescriptions  Reviews F.I.T.T. principles of resistance exercise including progression. Written material given at graduation.    Education: Exercise & Equipment Safety: - Individual verbal instruction and demonstration of equipment use and safety with use of the equipment. Flowsheet Row Cardiac Rehab from 06/28/2023 in [facility] Cardiac and Pulmonary Rehab   Date 06/28/23  Educator MB  Instruction Review Code 1- Verbalizes Understanding       Education: Exercise Physiology & General Exercise Guidelines: - Group verbal and written instruction with models to review the exercise physiology of the cardiovascular system and associated critical values. Provides general exercise guidelines with specific guidelines to those with heart or lung disease.    Education: Flexibility, Balance, Mind/Body Relaxation: - Group verbal and visual presentation with interactive activity on the components of exercise prescription. Introduces F.I.T.T principle from ACSM for exercise prescriptions. Reviews F.I.T.T. principles of flexibility and balance exercise training including progression. Also discusses the mind body connection.  Reviews various relaxation techniques to help reduce and manage stress (i.e. Deep breathing, progressive muscle relaxation, and visualization). Balance handout provided to take home. Written material given at graduation.   Activity Barriers & Risk Stratification:  Activity Barriers & Cardiac Risk Stratification - 06/28/23 1610       Activity Barriers & Cardiac Risk Stratification   Activity Barriers Other (comment);Joint Problems    Comments PAD- worse in one leg (Left), Left shoulder    Cardiac Risk Stratification High             6 Minute Walk:  6 Minute Walk     Row Name 06/28/23 1608         6 Minute Walk   Phase Initial     Distance 1365 feet     Walk Time 6 minutes     # of Rest Breaks 0     MPH 2.59     METS 3.65     RPE 10     Perceived Dyspnea  0     VO2 Peak 12.78     Symptoms Yes (comment)     Comments Leg/calf pain 4/10 while walking, 7/10 at end of test     Resting HR 58 bpm     Resting BP 134/64     Resting Oxygen Saturation  95 %     Exercise Oxygen Saturation  during 6 min walk 96 %     Max Ex. HR 89 bpm     Max Ex. BP 182/62     2 Minute Post BP 164/70              Oxygen Initial  Assessment:   Oxygen Re-Evaluation:   Oxygen Discharge (Final Oxygen Re-Evaluation):   Initial Exercise Prescription:  Initial Exercise Prescription - 06/28/23 1600       Date of Initial Exercise RX and Referring Provider   Date 06/28/23    Referring Provider Percival Brace, MD      Oxygen   Maintain Oxygen Saturation 88% or higher      Treadmill   MPH 2.5    Grade 0    Minutes 15    METs 2.91      REL-XR   Level 3    Speed 50    Minutes 15    METs 3.65      T5  Nustep   Level 3    SPM 80    Minutes 15    METs 3.65      Rower   Level 2    Watts 25    Minutes 15    METs 3.65      Prescription Details   Frequency (times per week) 3    Duration Progress to 30 minutes of continuous aerobic without signs/symptoms of physical distress      Intensity   THRR 40-80% of Max Heartrate 99-140    Ratings of Perceived Exertion 11-13    Perceived Dyspnea 0-4      Progression   Progression Continue to progress workloads to maintain intensity without signs/symptoms of physical distress.      Resistance Training   Training Prescription Yes    Weight 12 lb    Reps 10-15             Perform Capillary Blood Glucose checks as needed.  Exercise Prescription Changes:   Exercise Prescription Changes     Row Name 06/28/23 1600             Response to Exercise   Blood Pressure (Admit) 134/64       Blood Pressure (Exercise) 182/62       Blood Pressure (Exit) 164/70       Heart Rate (Admit) 58 bpm       Heart Rate (Exercise) 89 bpm       Heart Rate (Exit) 55 bpm       Oxygen Saturation (Admit) 95 %       Oxygen Saturation (Exercise) 96 %       Oxygen Saturation (Exit) 97 %       Rating of Perceived Exertion (Exercise) 10       Perceived Dyspnea (Exercise) 0       Symptoms leg/calf pain 4/10 walking, 7/10 at end of test       Comments results       Intensity THRR New         Progression   Average METs 3.65                Exercise  Comments:   Exercise Goals and Review:   Exercise Goals     Row Name 06/28/23 1614             Exercise Goals   Increase Physical Activity Yes       Intervention Provide advice, education, support and counseling about physical activity/exercise needs.;Develop an individualized exercise prescription for aerobic and resistive training based on initial evaluation findings, risk stratification, comorbidities and participant's personal goals.       Expected Outcomes Short Term: Attend rehab on a regular basis to increase amount of physical activity.;Long Term: Add in home exercise to make exercise part of routine and to increase amount of physical activity.;Long Term: Exercising regularly at least 3-5 days a week.       Increase Strength and Stamina Yes       Intervention Provide advice, education, support and counseling about physical activity/exercise needs.;Develop an individualized exercise prescription for aerobic and resistive training based on initial evaluation findings, risk stratification, comorbidities and participant's personal goals.       Expected Outcomes Short Term: Increase workloads from initial exercise prescription for resistance, speed, and METs.;Short Term: Perform resistance training exercises routinely during rehab and add in resistance training at home;Long Term: Improve cardiorespiratory fitness, muscular endurance and strength as measured by increased  METs and functional capacity ( )       Able to understand and use rate of perceived exertion (RPE) scale Yes       Intervention Provide education and explanation on how to use RPE scale       Expected Outcomes Short Term: Able to use RPE daily in rehab to express subjective intensity level;Long Term:  Able to use RPE to guide intensity level when exercising independently       Able to understand and use Dyspnea scale Yes       Intervention Provide education and explanation on how to use Dyspnea scale       Expected  Outcomes Short Term: Able to use Dyspnea scale daily in rehab to express subjective sense of shortness of breath during exertion;Long Term: Able to use Dyspnea scale to guide intensity level when exercising independently       Knowledge and understanding of Target Heart Rate Range (THRR) Yes       Intervention Provide education and explanation of THRR including how the numbers were predicted and where they are located for reference       Expected Outcomes Short Term: Able to state/look up THRR;Short Term: Able to use daily as guideline for intensity in rehab;Long Term: Able to use THRR to govern intensity when exercising independently       Able to check pulse independently Yes       Intervention Provide education and demonstration on how to check pulse in carotid and radial arteries.;Review the importance of being able to check your own pulse for safety during independent exercise       Expected Outcomes Short Term: Able to explain why pulse checking is important during independent exercise;Long Term: Able to check pulse independently and accurately       Understanding of Exercise Prescription Yes       Intervention Provide education, explanation, and written materials on patient's individual exercise prescription       Expected Outcomes Short Term: Able to explain program exercise prescription;Long Term: Able to explain home exercise prescription to exercise independently                Exercise Goals Re-Evaluation :   Discharge Exercise Prescription (Final Exercise Prescription Changes):  Exercise Prescription Changes - 06/28/23 1600       Response to Exercise   Blood Pressure (Admit) 134/64    Blood Pressure (Exercise) 182/62    Blood Pressure (Exit) 164/70    Heart Rate (Admit) 58 bpm    Heart Rate (Exercise) 89 bpm    Heart Rate (Exit) 55 bpm    Oxygen Saturation (Admit) 95 %    Oxygen Saturation (Exercise) 96 %    Oxygen Saturation (Exit) 97 %    Rating of Perceived Exertion  (Exercise) 10    Perceived Dyspnea (Exercise) 0    Symptoms leg/calf pain 4/10 walking, 7/10 at end of test    Comments results    Intensity THRR New      Progression   Average METs 3.65             Nutrition:  Target Goals: Understanding of nutrition guidelines, daily intake of sodium 1500mg , cholesterol 200mg , calories 30% from fat and 7% or less from saturated fats, daily to have 5 or more servings of fruits and vegetables.  Education: All About Nutrition: -Group instruction provided by verbal, written material, interactive activities, discussions, models, and posters to present general guidelines for heart healthy nutrition including  fat, fiber, MyPlate, the role of sodium in heart healthy nutrition, utilization of the nutrition label, and utilization of this knowledge for meal planning. Follow up email sent as well. Written material given at graduation. Flowsheet Row Cardiac Rehab from 06/28/2023 in [facility] Cardiac and Pulmonary Rehab  Education need identified 06/28/23       Biometrics:  Pre Biometrics - 06/28/23 1614       Pre Biometrics   Height 5' 11.46" (1.815 m)    Weight 228 lb 8 oz (103.6 kg)    Waist Circumference 44 inches    Hip Circumference 43 inches    Waist to Hip Ratio 1.02 %    BMI (Calculated) 31.46    Single Leg Stand 11 seconds              Nutrition Therapy Plan and Nutrition Goals:  Nutrition Therapy & Goals - 06/28/23 1615       Personal Nutrition Goals   Nutrition Goal Will meet w/ RD on 6/16      Intervention Plan   Intervention Prescribe, educate and counsel regarding individualized specific dietary modifications aiming towards targeted core components such as weight, hypertension, lipid management, diabetes, heart failure and other comorbidities.;Nutrition handout(s) given to patient.    Expected Outcomes Short Term Goal: Understand basic principles of dietary content, such as calories, fat, sodium, cholesterol and  nutrients.;Short Term Goal: A plan has been developed with personal nutrition goals set during dietitian appointment.;Long Term Goal: Adherence to prescribed nutrition plan.             Nutrition Assessments:  MEDIFICTS Score Key: >=70 Need to make dietary changes  40-70 Heart Healthy Diet <= 40 Therapeutic Level Cholesterol Diet  Flowsheet Row Cardiac Rehab from 06/28/2023 in [facility] Cardiac and Pulmonary Rehab  Picture Your Plate Total Score on Admission 58      Picture Your Plate Scores: <40 Unhealthy dietary pattern with much room for improvement. 41-50 Dietary pattern unlikely to meet recommendations for good health and room for improvement. 51-60 More healthful dietary pattern, with some room for improvement.  >60 Healthy dietary pattern, although there may be some specific behaviors that could be improved.    Nutrition Goals Re-Evaluation:   Nutrition Goals Discharge (Final Nutrition Goals Re-Evaluation):   Psychosocial: Target Goals: Acknowledge presence or absence of significant depression and/or stress, maximize coping skills, provide positive support system. Participant is able to verbalize types and ability to use techniques and skills needed for reducing stress and depression.   Education: Stress, Anxiety, and Depression - Group verbal and visual presentation to define topics covered.  Reviews how body is impacted by stress, anxiety, and depression.  Also discusses healthy ways to reduce stress and to treat/manage anxiety and depression.  Written material given at graduation.   Education: Sleep Hygiene -Provides group verbal and written instruction about how sleep can affect your health.  Define sleep hygiene, discuss sleep cycles and impact of sleep habits. Review good sleep hygiene tips.    Initial Review & Psychosocial Screening:  Initial Psych Review & Screening - 06/22/23 1109       Initial Review   Current issues with Current Stress Concerns    Source  of Stress Concerns Occupation      Family Dynamics   Good Support System? Yes   family and friends     Barriers   Psychosocial barriers to participate in program There are no identifiable barriers or psychosocial needs.;The patient should benefit from training in stress management  and relaxation.      Screening Interventions   Interventions Provide feedback about the scores to participant;Encouraged to exercise;To provide support and resources with identified psychosocial needs    Expected Outcomes Short Term goal: Utilizing psychosocial counselor, staff and physician to assist with identification of specific Stressors or current issues interfering with healing process. Setting desired goal for each stressor or current issue identified.;Long Term Goal: Stressors or current issues are controlled or eliminated.;Short Term goal: Identification and review with participant of any Quality of Life or Depression concerns found by scoring the questionnaire.;Long Term goal: The participant improves quality of Life and PHQ9 Scores as seen by post scores and/or verbalization of changes             Quality of Life Scores:   Quality of Life - 06/28/23 1615       Quality of Life   Select Quality of Life      Quality of Life Scores   Health/Function Pre 18.37 %    Socioeconomic Pre 21.43 %    Psych/Spiritual Pre 22.86 %    Family Pre 23.5 %    GLOBAL Pre 20.5 %            Scores of 19 and below usually indicate a poorer quality of life in these areas.  A difference of  2-3 points is a clinically meaningful difference.  A difference of 2-3 points in the total score of the Quality of Life Index has been associated with significant improvement in overall quality of life, self-image, physical symptoms, and general health in studies assessing change in quality of life.  PHQ-9: Review Flowsheet       06/28/2023  Depression screen PHQ 2/9  Decreased Interest 0  Down, Depressed, Hopeless 0  PHQ  - 2 Score 0  Altered sleeping 2  Tired, decreased energy 1  Change in appetite 0  Feeling bad or failure about yourself  0  Trouble concentrating 0  Moving slowly or fidgety/restless 0  Suicidal thoughts 0  PHQ-9 Score 3  Difficult doing work/chores Not difficult at all   Interpretation of Total Score  Total Score Depression Severity:  1-4 = Minimal depression, 5-9 = Mild depression, 10-14 = Moderate depression, 15-19 = Moderately severe depression, 20-27 = Severe depression   Psychosocial Evaluation and Intervention:  Psychosocial Evaluation - 06/22/23 1115       Psychosocial Evaluation & Interventions   Interventions Encouraged to exercise with the program and follow exercise prescription;Stress management education;Relaxation education    Comments Mr. Finlay is coming to cardiac rehab after a stent placement. He states his biggest stress concern is his job. 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 His family and friends are emotional support, but he states he doesn't want to bother them too much about things. He wants to attend the program to learn more about exercising and nutrition.    Expected Outcomes Short: attend cardiac rehab for education and exercise Long; develop and maintain positive self care habits    Continue Psychosocial Services  Follow up required by staff             Psychosocial Re-Evaluation:   Psychosocial Discharge (Final Psychosocial Re-Evaluation):   Vocational Rehabilitation: Provide vocational rehab assistance to qualifying candidates.   Vocational Rehab Evaluation & Intervention:  Vocational  Rehab - 06/22/23 1104       Initial Vocational Rehab Evaluation & Intervention   Assessment shows need for Vocational Rehabilitation No             Education: Education Goals: Education classes will be  provided on a variety of topics geared toward better understanding of heart health and risk factor modification. Participant will state understanding/return demonstration of topics presented as noted by education test scores.  Learning Barriers/Preferences:  Learning Barriers/Preferences - 06/22/23 1107       Learning Barriers/Preferences   Learning Barriers None    Learning Preferences None             General Cardiac Education Topics:  AED/CPR: - Group verbal and written instruction with the use of models to demonstrate the basic use of the AED with the basic ABC's of resuscitation.   Anatomy and Cardiac Procedures: - Group verbal and visual presentation and models provide information about basic cardiac anatomy and function. Reviews the testing methods done to diagnose heart disease and the outcomes of the test results. Describes the treatment choices: Medical Management, Angioplasty, or Coronary Bypass Surgery for treating various heart conditions including Myocardial Infarction, Angina, Valve Disease, and Cardiac Arrhythmias.  Written material given at graduation.   Medication Safety: - Group verbal and visual instruction to review commonly prescribed medications for heart and lung disease. Reviews the medication, class of the drug, and side effects. Includes the steps to properly store meds and maintain the prescription regimen.  Written material given at graduation.   Intimacy: - Group verbal instruction through game format to discuss how heart and lung disease can affect sexual intimacy. Written material given at graduation..   Know Your Numbers and Heart Failure: - Group verbal and visual instruction to discuss disease risk factors for cardiac and pulmonary disease and treatment options.  Reviews associated critical values for Overweight/Obesity, Hypertension, Cholesterol, and Diabetes.  Discusses basics of heart failure: signs/symptoms and treatments.  Introduces Heart  Failure Zone chart for action plan for heart failure.  Written material given at graduation.   Infection Prevention: - Provides verbal and written material to individual with discussion of infection control including proper hand washing and proper equipment cleaning during exercise session. Flowsheet Row Cardiac Rehab from 06/28/2023 in [facility] Cardiac and Pulmonary Rehab  Date 06/28/23  Educator MB  Instruction Review Code 1- Verbalizes Understanding       Falls Prevention: - Provides verbal and written material to individual with discussion of falls prevention and safety. Flowsheet Row Cardiac Rehab from 06/28/2023 in [facility] Cardiac and Pulmonary Rehab  Date 06/28/23  Educator MB  Instruction Review Code 1- Verbalizes Understanding       Other: -Provides group and verbal instruction on various topics (see comments)   Knowledge Questionnaire Score:  Knowledge Questionnaire Score - 06/28/23 1616       Knowledge Questionnaire Score   Pre Score 15/18             Core Components/Risk Factors/Patient Goals at Admission:  Personal Goals and Risk Factors at Admission - 06/28/23 1617       Core Components/Risk Factors/Patient Goals on Admission    Weight Management Yes;Weight Loss    Intervention Weight Management: Develop a combined nutrition and exercise program designed to reach desired caloric intake, while maintaining appropriate intake of nutrient and fiber, sodium and fats, and appropriate energy expenditure required for the weight goal.;Weight Management: Provide education and appropriate resources to help participant work on and attain  dietary goals.;Weight Management/Obesity: Establish reasonable short term and long term weight goals.    Admit Weight 228 lb 8 oz (103.6 kg)    Goal Weight: Short Term 220 lb (99.8 kg)    Goal Weight: Long Term 210 lb (95.3 kg)    Expected Outcomes Short Term: Continue to assess and modify interventions until short term weight is  achieved;Long Term: Adherence to nutrition and physical activity/exercise program aimed toward attainment of established weight goal;Weight Loss: Understanding of general recommendations for a balanced deficit meal plan, which promotes 1-2 lb weight loss per week and includes a negative energy balance of [phone] kcal/d;Understanding of distribution of calorie intake throughout the day with the consumption of 4-5 meals/snacks;Understanding recommendations for meals to include 15-35% energy as protein, 25-35% energy from fat, 35-60% energy from carbohydrates, less than 200mg  of dietary cholesterol, 20-35 gm of total fiber daily    Hypertension Yes    Intervention Provide education on lifestyle modifcations including regular physical activity/exercise, weight management, moderate sodium restriction and increased consumption of fresh fruit, vegetables, and low fat dairy, alcohol moderation, and smoking cessation.;Monitor prescription use compliance.    Expected Outcomes Long Term: Maintenance of blood pressure at goal levels.;Short Term: Continued assessment and intervention until BP is < 140/53mm HG in hypertensive participants. < 130/52mm HG in hypertensive participants with diabetes, heart failure or chronic kidney disease.    Lipids Yes    Intervention Provide education and support for participant on nutrition & aerobic/resistive exercise along with prescribed medications to achieve LDL 70mg , HDL >40mg .    Expected Outcomes Short Term: Participant states understanding of desired cholesterol values and is compliant with medications prescribed. Participant is following exercise prescription and nutrition guidelines.;Long Term: Cholesterol controlled with medications as prescribed, with individualized exercise RX and with personalized nutrition plan. Value goals: LDL < 70mg , HDL > 40 mg.             Education:Diabetes - Individual verbal and written instruction to review signs/symptoms of diabetes,  desired ranges of glucose level fasting, after meals and with exercise. Acknowledge that pre and post exercise glucose checks will be done for 3 sessions at entry of program.   Core Components/Risk Factors/Patient Goals Review:    Core Components/Risk Factors/Patient Goals at Discharge (Final Review):    ITP Comments:  ITP Comments     Row Name 06/22/23 1115 06/28/23 1608         ITP Comments Initial phone call completed. Diagnosis can be found in [facility] 5/20. EP Orientation scheduled for Thursday 6/5 at 2pm. Completed and gym orientation for cardiac rehab. Initial ITP created and sent for review to Dr. Firman Hughes, Medical Director.               Comments: Initial ITP

## 2023-06-28 NOTE — Patient Instructions (Signed)
 Patient Instructions  Patient Details  Name: Charles Ibarra MRN: [id number] Date of Birth: 1965/11/23 Referring Provider:  Percival Brace, MD  Below are your personal goals for exercise, nutrition, and risk factors. Our goal is to help you stay on track towards obtaining and maintaining these goals. We will be discussing your progress on these goals with you throughout the program.  Initial Exercise Prescription:  Initial Exercise Prescription - 06/28/23 1600       Date of Initial Exercise RX and Referring Provider   Date 06/28/23    Referring Provider Percival Brace, MD      Oxygen   Maintain Oxygen Saturation 88% or higher      Treadmill   MPH 2.5    Grade 0    Minutes 15    METs 2.91      REL-XR   Level 3    Speed 50    Minutes 15    METs 3.65      T5 Nustep   Level 3    SPM 80    Minutes 15    METs 3.65      Rower   Level 2    Watts 25    Minutes 15    METs 3.65      Prescription Details   Frequency (times per week) 3    Duration Progress to 30 minutes of continuous aerobic without signs/symptoms of physical distress      Intensity   THRR 40-80% of Max Heartrate 99-140    Ratings of Perceived Exertion 11-13    Perceived Dyspnea 0-4      Progression   Progression Continue to progress workloads to maintain intensity without signs/symptoms of physical distress.      Resistance Training   Training Prescription Yes    Weight 12 lb    Reps 10-15             Exercise Goals: Frequency: Be able to perform aerobic exercise two to three times per week in program working toward 2-5 days per week of home exercise.  Intensity: Work with a perceived exertion of 11 (fairly light) - 15 (hard) while following your exercise prescription.  We will make changes to your prescription with you as you progress through the program.   Duration: Be able to do 30 to 45 minutes of continuous aerobic exercise in addition to a 5 minute warm-up and a 5 minute  cool-down routine.   Nutrition Goals: Your personal nutrition goals will be established when you do your nutrition analysis with the dietician.  The following are general nutrition guidelines to follow: Cholesterol < 200mg /day Sodium < 1500mg /day Fiber: Men over 50 yrs - 30 grams per day  Personal Goals:  Personal Goals and Risk Factors at Admission - 06/28/23 1617       Core Components/Risk Factors/Patient Goals on Admission    Weight Management Yes;Weight Loss    Intervention Weight Management: Develop a combined nutrition and exercise program designed to reach desired caloric intake, while maintaining appropriate intake of nutrient and fiber, sodium and fats, and appropriate energy expenditure required for the weight goal.;Weight Management: Provide education and appropriate resources to help participant work on and attain dietary goals.;Weight Management/Obesity: Establish reasonable short term and long term weight goals.    Admit Weight 228 lb 8 oz (103.6 kg)    Goal Weight: Short Term 220 lb (99.8 kg)    Goal Weight: Long Term 210 lb (95.3 kg)    Expected Outcomes Short  Term: Continue to assess and modify interventions until short term weight is achieved;Long Term: Adherence to nutrition and physical activity/exercise program aimed toward attainment of established weight goal;Weight Loss: Understanding of general recommendations for a balanced deficit meal plan, which promotes 1-2 lb weight loss per week and includes a negative energy balance of [phone] kcal/d;Understanding of distribution of calorie intake throughout the day with the consumption of 4-5 meals/snacks;Understanding recommendations for meals to include 15-35% energy as protein, 25-35% energy from fat, 35-60% energy from carbohydrates, less than 200mg  of dietary cholesterol, 20-35 gm of total fiber daily    Hypertension Yes    Intervention Provide education on lifestyle modifcations including regular physical  activity/exercise, weight management, moderate sodium restriction and increased consumption of fresh fruit, vegetables, and low fat dairy, alcohol moderation, and smoking cessation.;Monitor prescription use compliance.    Expected Outcomes Long Term: Maintenance of blood pressure at goal levels.;Short Term: Continued assessment and intervention until BP is < 140/48mm HG in hypertensive participants. < 130/71mm HG in hypertensive participants with diabetes, heart failure or chronic kidney disease.    Lipids Yes    Intervention Provide education and support for participant on nutrition & aerobic/resistive exercise along with prescribed medications to achieve LDL 70mg , HDL >40mg .    Expected Outcomes Short Term: Participant states understanding of desired cholesterol values and is compliant with medications prescribed. Participant is following exercise prescription and nutrition guidelines.;Long Term: Cholesterol controlled with medications as prescribed, with individualized exercise RX and with personalized nutrition plan. Value goals: LDL < 70mg , HDL > 40 mg.             Tobacco Use Initial Evaluation: Social History   Tobacco Use  Smoking Status Former   Current packs/day: 0.00   Types: Cigarettes   Quit date: 04/02/2010   Years since quitting: 13.2  Smokeless Tobacco Never    Exercise Goals and Review:  Exercise Goals     Row Name 06/28/23 1614             Exercise Goals   Increase Physical Activity Yes       Intervention Provide advice, education, support and counseling about physical activity/exercise needs.;Develop an individualized exercise prescription for aerobic and resistive training based on initial evaluation findings, risk stratification, comorbidities and participant's personal goals.       Expected Outcomes Short Term: Attend rehab on a regular basis to increase amount of physical activity.;Long Term: Add in home exercise to make exercise part of routine and to  increase amount of physical activity.;Long Term: Exercising regularly at least 3-5 days a week.       Increase Strength and Stamina Yes       Intervention Provide advice, education, support and counseling about physical activity/exercise needs.;Develop an individualized exercise prescription for aerobic and resistive training based on initial evaluation findings, risk stratification, comorbidities and participant's personal goals.       Expected Outcomes Short Term: Increase workloads from initial exercise prescription for resistance, speed, and METs.;Short Term: Perform resistance training exercises routinely during rehab and add in resistance training at home;Long Term: Improve cardiorespiratory fitness, muscular endurance and strength as measured by increased METs and functional capacity ( )       Able to understand and use rate of perceived exertion (RPE) scale Yes       Intervention Provide education and explanation on how to use RPE scale       Expected Outcomes Short Term: Able to use RPE daily in rehab to express subjective  intensity level;Long Term:  Able to use RPE to guide intensity level when exercising independently       Able to understand and use Dyspnea scale Yes       Intervention Provide education and explanation on how to use Dyspnea scale       Expected Outcomes Short Term: Able to use Dyspnea scale daily in rehab to express subjective sense of shortness of breath during exertion;Long Term: Able to use Dyspnea scale to guide intensity level when exercising independently       Knowledge and understanding of Target Heart Rate Range (THRR) Yes       Intervention Provide education and explanation of THRR including how the numbers were predicted and where they are located for reference       Expected Outcomes Short Term: Able to state/look up THRR;Short Term: Able to use daily as guideline for intensity in rehab;Long Term: Able to use THRR to govern intensity when exercising  independently       Able to check pulse independently Yes       Intervention Provide education and demonstration on how to check pulse in carotid and radial arteries.;Review the importance of being able to check your own pulse for safety during independent exercise       Expected Outcomes Short Term: Able to explain why pulse checking is important during independent exercise;Long Term: Able to check pulse independently and accurately       Understanding of Exercise Prescription Yes       Intervention Provide education, explanation, and written materials on patient's individual exercise prescription       Expected Outcomes Short Term: Able to explain program exercise prescription;Long Term: Able to explain home exercise prescription to exercise independently

## 2023-06-29 ENCOUNTER — Encounter (INDEPENDENT_AMBULATORY_CARE_PROVIDER_SITE_OTHER): Payer: Self-pay | Admitting: Vascular Surgery

## 2023-06-29 ENCOUNTER — Ambulatory Visit (INDEPENDENT_AMBULATORY_CARE_PROVIDER_SITE_OTHER): Payer: 59

## 2023-06-29 ENCOUNTER — Ambulatory Visit (INDEPENDENT_AMBULATORY_CARE_PROVIDER_SITE_OTHER): Payer: 59 | Admitting: Vascular Surgery

## 2023-06-29 VITALS — BP 156/86 | Resp 18 | Ht 72.0 in | Wt 227.4 lb

## 2023-06-29 DIAGNOSIS — I10 Essential (primary) hypertension: Secondary | ICD-10-CM

## 2023-06-29 DIAGNOSIS — I70219 Atherosclerosis of native arteries of extremities with intermittent claudication, unspecified extremity: Secondary | ICD-10-CM

## 2023-06-29 DIAGNOSIS — N183 Chronic kidney disease, stage 3 unspecified: Secondary | ICD-10-CM

## 2023-06-29 DIAGNOSIS — I6523 Occlusion and stenosis of bilateral carotid arteries: Secondary | ICD-10-CM

## 2023-06-29 DIAGNOSIS — E785 Hyperlipidemia, unspecified: Secondary | ICD-10-CM | POA: Diagnosis not present

## 2023-06-29 DIAGNOSIS — E1122 Type 2 diabetes mellitus with diabetic chronic kidney disease: Secondary | ICD-10-CM

## 2023-06-29 NOTE — Progress Notes (Signed)
 Subjective:    Patient ID: Charles Ibarra, male    DOB: 22-Sep-1965, 58 y.o.   MRN: [id number] No chief complaint on file.   Charles Ibarra is a 58 yo male who presents to clinic today for 6 month follow up of carotid disease as well as bilateral lower extremity PAD.  Patient endorses today that he is having some symptoms to his left lower leg.  He recently underwent cardiac stent placement for chest pain and has been unable to exercise/walk.  Today he endorses he can only walk about a quarter of a mile before having to sit and rest.  He describes the pain as throbbing aching limiting his ability to move his leg.  He denies any swelling to his bilateral lower extremities.  He endorses that his right leg feels fine.  He agrees that this is limiting his lifestyle but he is continuing to exercise now that his chest pain has been resolved.  He denies any signs or symptoms of CVA/TIA or amaurosis fugax.    Review of Systems  Constitutional: Negative.   Musculoskeletal:        Positive for pain and throbbing to his left lower extremity while walking a quarter of a mile  Neurological: Negative.   Psychiatric/Behavioral: Negative.    All other systems reviewed and are negative.      Objective:    Physical Exam Vitals reviewed.  Constitutional:      Appearance: Normal appearance. He is normal weight.  HENT:     Head: Normocephalic.  Eyes:     Pupils: Pupils are equal, round, and reactive to light.  Cardiovascular:     Rate and Rhythm: Normal rate and regular rhythm.     Pulses: Normal pulses.     Heart sounds: Normal heart sounds.  Pulmonary:     Effort: Pulmonary effort is normal.     Breath sounds: Normal breath sounds.  Abdominal:     General: Abdomen is flat. Bowel sounds are normal.     Palpations: Abdomen is soft.  Musculoskeletal:        General: Normal range of motion.     Cervical back: Normal range of motion.  Skin:    General: Skin is warm and dry.     Capillary  Refill: Capillary refill takes 2 to 3 seconds.  Neurological:     General: No focal deficit present.     Mental Status: He is alert and oriented to person, place, and time. Mental status is at baseline.  Psychiatric:        Mood and Affect: Mood normal.        Behavior: Behavior normal.        Thought Content: Thought content normal.        Judgment: Judgment normal.     There were no vitals taken for this visit.  Past Medical History:  Diagnosis Date   Alcohol abuse    Aortic stenosis    Aortic stenosis    Atherosclerotic PVD with intermittent claudication (HCC)    Chronic kidney disease, stage 3 (moderate)    Coronary artery disease involving native coronary artery of native heart with angina pectoris (HCC)    Diabetes mellitus without complication (HCC)    HTN (hypertension)    Hyperlipidemia    Ischemic cardiomyopathy    S/P CABG (coronary artery bypass graft)     Social History   Socioeconomic History   Marital status: Divorced    Spouse name: Not on file  Number of children: 1   Years of education: Not on file   Highest education level: Not on file  Occupational History   Not on file  Tobacco Use   Smoking status: Former    Current packs/day: 0.00    Types: Cigarettes    Quit date: 04/02/2010    Years since quitting: 13.2   Smokeless tobacco: Never  Vaping Use   Vaping status: Never Used  Substance and Sexual Activity   Alcohol use: Yes    Alcohol/week: 18.0 standard drinks of alcohol    Types: 18 Cans of beer per week    Comment: 6-7 beers on weekends   Drug use: Not Currently    Comment: CBD (smokable )   Sexual activity: Not Currently  Other Topics Concern   Not on file  Social History Narrative   Daughter , who lives in [place], [place]   Social Drivers of Health   Financial Resource Strain: Low Risk  (04/19/2023)   Received from [facility]   Overall Financial Resource Strain (CARDIA)    Difficulty of Paying Living Expenses:  Not hard at all  Food Insecurity: No Food Insecurity (04/19/2023)   Received from [facility]   Hunger Vital Sign    Worried About Running Out of Food in the Last Year: Never true    Ran Out of Food in the Last Year: Never true  Transportation Needs: No Transportation Needs (04/19/2023)   Received from [facility] - Transportation    In the past 12 months, has lack of transportation kept you from medical appointments or from getting medications?: No    Lack of Transportation (Non-Medical): No  Physical Activity: Not on file  Stress: Not on file  Social Connections: Unknown (06/07/2021)   Received from [facility]   Social Network    Social Network: Not on file  Intimate Partner Violence: Unknown (04/29/2021)   Received from [facility]   HITS    Physically Hurt: Not on file    Insult or Talk Down To: Not on file    Threaten Physical Harm: Not on file    Scream or Curse: Not on file    Past Surgical History:  Procedure Laterality Date   BACK SURGERY     CAROTID ANGIOGRAPHY Right 12/26/2021   Procedure: CAROTID ANGIOGRAPHY;  Surgeon: Celso College, MD;  Location: [facility] INVASIVE CV LAB;  Service: Cardiovascular;  Laterality: Right;   CAROTID PTA/STENT INTERVENTION Left 02/10/2021   Procedure: CAROTID PTA/STENT INTERVENTION;  Surgeon: Celso College, MD;  Location: [facility] INVASIVE CV LAB;  Service: Cardiovascular;  Laterality: Left;   COLONOSCOPY     COLONOSCOPY N/A 03/09/2022   Procedure: COLONOSCOPY;  Surgeon: Quintin Buckle, DO;  Location: [facility] ENDOSCOPY;  Service: Gastroenterology;  Laterality: N/A;   CORONARY ARTERY BYPASS GRAFT     LEFT HEART CATH AND CORS/GRAFTS ANGIOGRAPHY Left 05/30/2023   Procedure: LEFT HEART CATH AND CORS/GRAFTS ANGIOGRAPHY;  Surgeon: Marco Severs, MD;  Location: [facility] INVASIVE CV LAB;  Service: Cardiovascular;  Laterality: Left;   LOWER EXTREMITY ANGIOGRAPHY Right 07/11/2021    Procedure: Lower Extremity Angiography;  Surgeon: Celso College, MD;  Location: [facility] INVASIVE CV LAB;  Service: Cardiovascular;  Laterality: Right;   NASAL FRACTURE SURGERY     TONSILLECTOMY     WISDOM TOOTH EXTRACTION      No family history on file.  No Known Allergies  Latest Ref Rng & Units 06/08/2023    8:32 AM 02/11/2021    5:22 AM  CBC  WBC 4.0 - 10.5 K/uL 5.1  5.7   Hemoglobin 13.0 - 17.0 g/dL 14.7  82.9   Hematocrit 39.0 - 52.0 % 35.5  31.7   Platelets 150 - 400 K/uL 154  118        CMP     Component Value Date/Time   NA 135 06/08/2023 0832   K 4.2 06/08/2023 0832   CL 104 06/08/2023 0832   CO2 25 06/08/2023 0832   GLUCOSE 204 (H) 06/08/2023 0832   BUN 17 06/08/2023 0832   CREATININE 1.25 (H) 06/08/2023 0832   CALCIUM  9.0 06/08/2023 0832   GFRNONAA >60 06/08/2023 0832     No results found.     Assessment & Plan:   1. Bilateral carotid artery stenosis (Primary) Carotid duplex last month show increase in the velocities in the right carotid artery now measuring in the 40-59% range.  The left carotid stent is patent with velocities in the 40-59% range.  This is a decrease but I will still continue to follow it on 45-month intervals for now due to the previous concern for a mobile thrombus/plaque.  Continue current medical regimen which includes aspirin , Plavix , and Lipitor with increasing his Lipitor  2. Atherosclerotic PVD with intermittent claudication (HCC) His ABIs were performed 6 months ago showed a right ABI of 1.03 with brisk waveforms and a digit pressure of 152. Six months ago the left ABI was decreased to 0.68 with a digit pressure of 102.  Today's Right ABI is .95 and Today's left ABI is decreased to .46  Patient endorses symptoms are currently mild and lifestyle limiting. He endorses only being able to walk a quarter of a mile before stopping to rest.   I recommend at this time that we do not repeat a left lower extremity angiogram with possible stent  placement as he has had before on his right side with no symptoms become ischemic.  Patient adamantly refuses today and states that he wants to get back to continuing to exercise to control his disease.  He endorses he recently had a cardiac stent placed due to chest pain and during that time he was unable to exercise or walk and believes now that he can because his chest pain has subsided he wishes to continue without undergoing an angiogram.  Again I advised him the best recommendation would be angiogram at this point in time and again he adamantly refuses.  I recommend continuing dual antiplatelet therapy and increasing statin agent.  Follow-up in 3 months with repeat noninvasive studies.   3. Hyperlipidemia, unspecified hyperlipidemia type lipid control important in reducing the progression of atherosclerotic disease. Continue statin therapy   4. Essential hypertension blood pressure control important in reducing the progression of atherosclerotic disease. On appropriate oral medications.   5. Type 2 diabetes mellitus with stage 3 chronic kidney disease, without long-term current use of insulin, unspecified whether stage 3a or 3b CKD (HCC) blood glucose control important in reducing the progression of atherosclerotic disease. Also, involved in wound healing. On appropriate medications.    Current Outpatient Medications on File Prior to Visit  Medication Sig Dispense Refill   aspirin  EC 81 MG tablet Take 81 mg by mouth daily.     atorvastatin  (LIPITOR) 80 MG tablet Take 80 mg by mouth at bedtime.     carvedilol  (COREG ) 25 MG tablet Take 12.5 mg by mouth  2 (two) times daily with a meal.     clopidogrel  (PLAVIX ) 75 MG tablet Take 1 tablet by mouth once daily 90 tablet 1   colchicine 0.6 MG tablet Take 0.6 mg by mouth daily as needed (Gout). (Patient not taking: Reported on 06/22/2023)     cyanocobalamin (VITAMIN B12) 1000 MCG tablet Take 1,000 mcg by mouth daily. (Patient not taking: Reported on  06/22/2023)     ezetimibe (ZETIA) 10 MG tablet Take 10 mg by mouth daily.     losartan  (COZAAR ) 100 MG tablet Take 100 mg by mouth daily.     nitroGLYCERIN  (NITROSTAT ) 0.3 MG SL tablet Place 1 tablet (0.3 mg total) under the tongue every 5 (five) minutes as needed for chest pain. 100 tablet 3   tadalafil (CIALIS) 20 MG tablet Take 20 mg by mouth daily as needed for erectile dysfunction.     torsemide  (DEMADEX ) 20 MG tablet Take 20 mg by mouth daily.     No current facility-administered medications on file prior to visit.    There are no Patient Instructions on file for this visit. No follow-ups on file.   Annamaria Barrette, NP

## 2023-07-03 LAB — VAS US ABI WITH/WO TBI
Left ABI: 0.46
Right ABI: 0.95

## 2023-07-04 ENCOUNTER — Encounter: Admitting: *Deleted

## 2023-07-04 DIAGNOSIS — Z48812 Encounter for surgical aftercare following surgery on the circulatory system: Secondary | ICD-10-CM | POA: Diagnosis not present

## 2023-07-04 DIAGNOSIS — Z955 Presence of coronary angioplasty implant and graft: Secondary | ICD-10-CM

## 2023-07-04 NOTE — Progress Notes (Signed)
 Daily Session Note  Patient Details  Name: MALE MINISH MRN: [id number] Date of Birth: October 15, 1965 Referring Provider:   Flowsheet Row Cardiac Rehab from 06/28/2023 in [facility] [address] [facility]  Referring Provider Percival Brace, MD       Encounter Date: 07/04/2023  Check In:  Session Check In - 07/04/23 1541       Check-In   Supervising physician immediately available to respond to emergencies See telemetry face sheet for immediately available ER MD    Location [facility]-Cardiac & Pulmonary Rehab    Staff Present Sue Em RN,BSN;Susanne Bice, RN, BSN, CCRP;Maxon Conetta BS, Exercise Physiologist;Kelly Bollinger RN,BSN,MPA    Virtual Visit No    Medication changes reported     No    Fall or balance concerns reported    No    Warm-up and Cool-down Performed on first and last piece of equipment    Resistance Training Performed Yes    VAD Patient? No    PAD/SET Patient? No      Pain Assessment   Currently in Pain? No/denies                Social History   Tobacco Use  Smoking Status Former   Current packs/day: 0.00   Types: Cigarettes   Quit date: 04/02/2010   Years since quitting: 13.2  Smokeless Tobacco Never    Goals Met:  Independence with exercise equipment Exercise tolerated well No report of concerns or symptoms today Strength training completed today  Goals Unmet:  Not Applicable  Comments: First full day of exercise!  Patient was oriented to gym and equipment including functions, settings, policies, and procedures.  Patient's individual exercise prescription and treatment plan were reviewed.  All starting workloads were established based on the results of the 6 minute walk test done at initial orientation visit.  The plan for exercise progression was also introduced and progression will be customized based on patient's performance and goals.    Dr. Firman Hughes is Medical Director for [facility] Cardiac Rehabilitation.  Dr. Fuad  Aleskerov is Medical Director for [facility] Pulmonary Rehabilitation.

## 2023-07-05 ENCOUNTER — Encounter: Admitting: *Deleted

## 2023-07-05 DIAGNOSIS — Z48812 Encounter for surgical aftercare following surgery on the circulatory system: Secondary | ICD-10-CM | POA: Diagnosis not present

## 2023-07-05 DIAGNOSIS — Z955 Presence of coronary angioplasty implant and graft: Secondary | ICD-10-CM

## 2023-07-05 NOTE — Progress Notes (Signed)
 Daily Session Note  Patient Details  Name: Charles Ibarra MRN: [id number] Date of Birth: 05-22-1965 Referring Provider:   Flowsheet Row Cardiac Rehab from 06/28/2023 in [facility] Cardiac and Pulmonary Rehab  Referring Provider Percival Brace, MD    Encounter Date: 07/05/2023  Check In:  Session Check In - 07/05/23 1529       Check-In   Supervising physician immediately available to respond to emergencies See telemetry face sheet for immediately available ER MD    Location [facility]-Cardiac & Pulmonary Rehab    Staff Present Sue Em RN,BSN;Maxon Conetta BS, Exercise Physiologist;Noah Tickle, BS, Exercise Physiologist    Virtual Visit No    Medication changes reported     No    Fall or balance concerns reported    No    Warm-up and Cool-down Performed on first and last piece of equipment    Resistance Training Performed Yes    VAD Patient? No    PAD/SET Patient? No      Pain Assessment   Currently in Pain? No/denies             Social History   Tobacco Use  Smoking Status Former   Current packs/day: 0.00   Types: Cigarettes   Quit date: 04/02/2010   Years since quitting: 13.2  Smokeless Tobacco Never    Goals Met:  Independence with exercise equipment Exercise tolerated well No report of concerns or symptoms today Strength training completed today  Goals Unmet:  Not Applicable  Comments: Pt able to follow exercise prescription today without complaint.  Will continue to monitor for progression.    Dr. Firman Hughes is Medical Director for [facility] Cardiac Rehabilitation.  Dr. Fuad Aleskerov is Medical Director for [facility] Pulmonary Rehabilitation.

## 2023-07-09 ENCOUNTER — Encounter

## 2023-07-09 ENCOUNTER — Encounter: Admitting: *Deleted

## 2023-07-09 DIAGNOSIS — Z48812 Encounter for surgical aftercare following surgery on the circulatory system: Secondary | ICD-10-CM | POA: Diagnosis not present

## 2023-07-09 DIAGNOSIS — Z955 Presence of coronary angioplasty implant and graft: Secondary | ICD-10-CM

## 2023-07-09 NOTE — Progress Notes (Signed)
 Assessment start time: 4:32 PM  Digestive issues/concerns: no known food allergies   24-hours Recall: B: 3 bacon and short stack, coffee L: potato salad D: chicken, broccoli, rice pilaf   Beverages sprite zero 64-90oz, coffee, sweet tea sometimes. 20oz of water   Education r/t nutrition plan: Patient drinking a lot of sprite zero and only about 20oz of water. Set goal to replace some of his sprite zero with more plain water. He has attended nutrition education class at rehab and has been reading labels to try and keep intake of sodium and saturated fat down below 1500mg  and 12g respectively. Provided mediterranean diet handout. Educated on types of fats, sources, and how to read labels. Brainstormed meal and snack ideas with foods he likes and will focusing on protein carb pairing.    Goal 1: Drink 48oz of plain water and reduce sprite zero Goal 2: Read labels and reduce sodium intake to below 2300mg . Ideally 1500mg  per day.  Goal 3: Include more colorful produce, aim for 5-8 servings of fruits and veggies per day   End time 5:03 PM

## 2023-07-09 NOTE — Progress Notes (Signed)
 Daily Session Note  Patient Details  Name: Charles Ibarra MRN: [id number] Date of Birth: 14-Nov-1965 Referring Provider:   Flowsheet Row Cardiac Rehab from 06/28/2023 in [facility] Cardiac and Pulmonary Rehab  Referring Provider Percival Brace, MD    Encounter Date: 07/09/2023  Check In:  Session Check In - 07/09/23 1528       Check-In   Supervising physician immediately available to respond to emergencies See telemetry face sheet for immediately available ER MD    Location [facility]-Cardiac & Pulmonary Rehab    Staff Present Sue Em RN,BSN;Joseph Nancey Awkward;Maud Sorenson, RN, BSN, CCRP;Margaret Best, MS, Exercise Physiologist;Maxon [employer], Exercise Physiologist    Virtual Visit No    Medication changes reported     No    Fall or balance concerns reported    No    Warm-up and Cool-down Performed on first and last piece of equipment    Resistance Training Performed Yes    VAD Patient? No    PAD/SET Patient? No      Pain Assessment   Currently in Pain? No/denies             Social History   Tobacco Use  Smoking Status Former   Current packs/day: 0.00   Types: Cigarettes   Quit date: 04/02/2010   Years since quitting: 13.2  Smokeless Tobacco Never    Goals Met:  Independence with exercise equipment Exercise tolerated well No report of concerns or symptoms today Strength training completed today  Goals Unmet:  Not Applicable  Comments: Pt able to follow exercise prescription today without complaint.  Will continue to monitor for progression.    Dr. Firman Hughes is Medical Director for [facility] Card[facility] Rehabilitation.  Dr. Fuad Aleskerov is Medical Director for [facility] Pulmonary Rehabilitation.

## 2023-07-11 ENCOUNTER — Encounter

## 2023-07-12 ENCOUNTER — Encounter

## 2023-07-16 ENCOUNTER — Encounter

## 2023-07-18 ENCOUNTER — Encounter

## 2023-07-19 ENCOUNTER — Encounter

## 2023-07-23 ENCOUNTER — Encounter

## 2023-07-25 ENCOUNTER — Encounter: Payer: Self-pay | Admitting: *Deleted

## 2023-07-25 ENCOUNTER — Encounter: Attending: Cardiology

## 2023-07-25 DIAGNOSIS — Z955 Presence of coronary angioplasty implant and graft: Secondary | ICD-10-CM | POA: Insufficient documentation

## 2023-07-25 DIAGNOSIS — Z48812 Encounter for surgical aftercare following surgery on the circulatory system: Secondary | ICD-10-CM | POA: Insufficient documentation

## 2023-07-25 NOTE — Progress Notes (Signed)
 Cardiac Individual Treatment Plan  Patient Details  Name: Charles Ibarra MRN: [id number] Date of Birth: [birth year] Referring Provider:   Flowsheet Row Cardiac Rehab from 06/28/2023 in [facility] Cardiac and Pulmonary Rehab  Referring Provider Ammon Blunt, MD    Initial Encounter Date:  Flowsheet Row Cardiac Rehab from 06/28/2023 in [facility] Cardiac and Pulmonary Rehab  Date 06/28/23    Visit Diagnosis: Status post coronary artery stent placement  Patient's Home Medications on Admission:  Current Outpatient Medications:    aspirin  EC 81 MG tablet, Take 81 mg by mouth daily., Disp: , Rfl:    atorvastatin  (LIPITOR) 80 MG tablet, Take 80 mg by mouth at bedtime., Disp: , Rfl:    carvedilol  (COREG ) 25 MG tablet, Take 12.5 mg by mouth 2 (two) times daily with a meal., Disp: , Rfl:    clopidogrel  (PLAVIX ) 75 MG tablet, Take 1 tablet by mouth once daily, Disp: 90 tablet, Rfl: 1   colchicine 0.6 MG tablet, Take 0.6 mg by mouth daily as needed (Gout)., Disp: , Rfl:    cyanocobalamin (VITAMIN B12) 1000 MCG tablet, Take 1,000 mcg by mouth daily. (Patient not taking: Reported on 06/22/2023), Disp: , Rfl:    ezetimibe (ZETIA) 10 MG tablet, Take 10 mg by mouth daily., Disp: , Rfl:    losartan  (COZAAR ) 100 MG tablet, Take 100 mg by mouth daily., Disp: , Rfl:    nitroGLYCERIN  (NITROSTAT ) 0.3 MG SL tablet, Place 1 tablet (0.3 mg total) under the tongue every 5 (five) minutes as needed for chest pain., Disp: 100 tablet, Rfl: 3   tadalafil (CIALIS) 20 MG tablet, Take 20 mg by mouth daily as needed for erectile dysfunction., Disp: , Rfl:    torsemide  (DEMADEX ) 20 MG tablet, Take 20 mg by mouth daily., Disp: , Rfl:   Past Medical History: Past Medical History:  Diagnosis Date   Alcohol abuse    Aortic stenosis    Aortic stenosis    Atherosclerotic PVD with intermittent claudication (HCC)    Chronic kidney disease, stage 3 (moderate)    Coronary artery disease involving native coronary artery of native  heart with angina pectoris (HCC)    Diabetes mellitus without complication (HCC)    HTN (hypertension)    Hyperlipidemia    Ischemic cardiomyopathy    S/P CABG (coronary artery bypass graft)     Tobacco Use: Social History   Tobacco Use  Smoking Status Former   Current packs/day: 0.00   Types: Cigarettes   Quit date: 04/02/2010   Years since quitting: 13.3  Smokeless Tobacco Never    Labs: Review Flowsheet        No data to display           Exercise Target Goals: Exercise Program Goal: Individual exercise prescription set using results from initial 6 min walk test and THRR while considering  patient's activity barriers and safety.   Exercise Prescription Goal: Initial exercise prescription builds to 30-45 minutes a day of aerobic activity, 2-3 days per week.  Home exercise guidelines will be given to patient during program as part of exercise prescription that the participant will acknowledge.   Education: Aerobic Exercise: - Group verbal and visual presentation on the components of exercise prescription. Introduces F.I.T.T principle from ACSM for exercise prescriptions.  Reviews F.I.T.T. principles of aerobic exercise including progression. Written material given at graduation. Flowsheet Row Cardiac Rehab from 07/04/2023 in [facility] Cardiac [facility] Pulmonary Rehab  Education need identified 06/28/23    Education: Resistance Exercise: - Group  verbal and visual presentation on the components of exercise prescription. Introduces F.I.T.T principle from ACSM for exercise prescriptions  Reviews F.I.T.T. principles of resistance exercise including progression. Written material given at graduation.    Education: Exercise & Equipment Safety: - Individual verbal instruction and demonstration of equipment use and safety with use of the equipment. Flowsheet Row Cardiac Rehab from 07/04/2023 in [facility] Cardiac and Pulmonary Rehab  Date 06/28/23  Educator MB  Instruction Review Code 1-  Verbalizes Understanding    Education: Exercise Physiology & General Exercise Guidelines: - Group verbal and written instruction with models to review the exercise physiology of the cardiovascular system and associated critical values. Provides general exercise guidelines with specific guidelines to those with heart or lung disease.    Education: Flexibility, Balance, Mind/Body Relaxation: - Group verbal and visual presentation with interactive activity on the components of exercise prescription. Introduces F.I.T.T principle from ACSM for exercise prescriptions. Reviews F.I.T.T. principles of flexibility and balance exercise training including progression. Also discusses the mind body connection.  Reviews various relaxation techniques to help reduce and manage stress (i.e. Deep breathing, progressive muscle relaxation, and visualization). Balance handout provided to take home. Written material given at graduation.   Activity Barriers & Risk Stratification:  Activity Barriers & Cardiac Risk Stratification - 06/28/23 1610       Activity Barriers & Cardiac Risk Stratification   Activity Barriers Other (comment);Joint Problems    Comments PAD- worse in one leg (Left), Left shoulder    Cardiac Risk Stratification High          6 Minute Walk:  6 Minute Walk     Row Name 06/28/23 1608         6 Minute Walk   Phase Initial     Distance 1365 feet     Walk Time 6 minutes     # of Rest Breaks 0     MPH 2.59     METS 3.65     RPE 10     Perceived Dyspnea  0     VO2 Peak 12.78     Symptoms Yes (comment)     Comments Leg/calf pain 4/10 while walking, 7/10 at end of test     Resting HR 58 bpm     Resting BP 134/64     Resting Oxygen Saturation  95 %     Exercise Oxygen Saturation  during 6 min walk 96 %     Max Ex. HR 89 bpm     Max Ex. BP 182/62     2 Minute Post BP 164/70        Oxygen Initial Assessment:   Oxygen Re-Evaluation:   Oxygen Discharge (Final Oxygen  Re-Evaluation):   Initial Exercise Prescription:  Initial Exercise Prescription - 06/28/23 1600       Date of Initial Exercise RX and Referring Provider   Date 06/28/23    Referring Provider Ammon Blunt, MD      Oxygen   Maintain Oxygen Saturation 88% or higher      Treadmill   MPH 2.5    Grade 0    Minutes 15    METs 2.91      REL-XR   Level 3    Speed 50    Minutes 15    METs 3.65      T5 Nustep   Level 3    SPM 80    Minutes 15    METs 3.65      Rower  Level 2    Watts 25    Minutes 15    METs 3.65      Prescription Details   Frequency (times per week) 3    Duration Progress to 30 minutes of continuous aerobic without signs/symptoms of physical distress      Intensity   THRR 40-80% of Max Heartrate 99-140    Ratings of Perceived Exertion 11-13    Perceived Dyspnea 0-4      Progression   Progression Continue to progress workloads to maintain intensity without signs/symptoms of physical distress.      Resistance Training   Training Prescription Yes    Weight 12 lb    Reps 10-15          Perform Capillary Blood Glucose checks as needed.  Exercise Prescription Changes:   Exercise Prescription Changes     Row Name 06/28/23 1600 07/12/23 1100 07/24/23 1500         Response to Exercise   Blood Pressure (Admit) 134/64 124/68 142/60     Blood Pressure (Exercise) 182/62 162/76 170/64     Blood Pressure (Exit) 164/70 114/58 118/54     Heart Rate (Admit) 58 bpm 56 bpm 51 bpm     Heart Rate (Exercise) 89 bpm 99 bpm 98 bpm     Heart Rate (Exit) 55 bpm 68 bpm 64 bpm     Oxygen Saturation (Admit) 95 % -- --     Oxygen Saturation (Exercise) 96 % -- --     Oxygen Saturation (Exit) 97 % -- --     Rating of Perceived Exertion (Exercise) 10 13 13      Perceived Dyspnea (Exercise) 0 -- --     Symptoms leg/calf pain 4/10 walking, 7/10 at end of test none none     Comments results First two days of exercise --     Duration -- Continue with  30 min of aerobic exercise without signs/symptoms of physical distress. Continue with 30 min of aerobic exercise without signs/symptoms of physical distress.     Intensity THRR New THRR unchanged THRR unchanged       Progression   Progression -- Continue to progress workloads to maintain intensity without signs/symptoms of physical distress. Continue to progress workloads to maintain intensity without signs/symptoms of physical distress.     Average METs 3.65 3.31 3.65       Resistance Training   Training Prescription -- Yes Yes     Weight -- 3 lb 3 lb     Reps -- 10-15 10-15       Interval Training   Interval Training -- No No       Treadmill   MPH -- 2.5 2.5     Grade -- 0 0     Minutes -- 15 15     METs -- 2.91 2.91       REL-XR   Level -- 7 --     Minutes -- 15 --     METs -- 4.1 --       Rower   Level -- -- 2     Watts -- -- 27     Minutes -- -- 15     METs -- -- 4.39       Oxygen   Maintain Oxygen Saturation -- 88% or higher 88% or higher        Exercise Comments:   Exercise Comments     Row Name 07/04/23 1542  Exercise Comments First full day of exercise!  Patient was oriented to gym and equipment including functions, settings, policies, and procedures.  Patient's individual exercise prescription and treatment plan were reviewed.  All starting workloads were established based on the results of the 6 minute walk test done at initial orientation visit.  The plan for exercise progression was also introduced and progression will be customized based on patient's performance and goals.          Exercise Goals and Review:   Exercise Goals     Row Name 06/28/23 1614             Exercise Goals   Increase Physical Activity Yes       Intervention Provide advice, education, support and counseling about physical activity/exercise needs.;Develop an individualized exercise prescription for aerobic and resistive training based on initial evaluation  findings, risk stratification, comorbidities and participant's personal goals.       Expected Outcomes Short Term: Attend rehab on a regular basis to increase amount of physical activity.;Long Term: Add in home exercise to make exercise part of routine and to increase amount of physical activity.;Long Term: Exercising regularly at least 3-5 days a week.       Increase Strength and Stamina Yes       Intervention Provide advice, education, support and counseling about physical activity/exercise needs.;Develop an individualized exercise prescription for aerobic and resistive training based on initial evaluation findings, risk stratification, comorbidities and participant's personal goals.       Expected Outcomes Short Term: Increase workloads from initial exercise prescription for resistance, speed, and METs.;Short Term: Perform resistance training exercises routinely during rehab and add in resistance training at home;Long Term: Improve cardiorespiratory fitness, muscular endurance and strength as measured by increased METs and functional capacity ( )       Able to understand and use rate of perceived exertion (RPE) scale Yes       Intervention Provide education and explanation on how to use RPE scale       Expected Outcomes Short Term: Able to use RPE daily in rehab to express subjective intensity level;Long Term:  Able to use RPE to guide intensity level when exercising independently       Able to understand and use Dyspnea scale Yes       Intervention Provide education and explanation on how to use Dyspnea scale       Expected Outcomes Short Term: Able to use Dyspnea scale daily in rehab to express subjective sense of shortness of breath during exertion;Long Term: Able to use Dyspnea scale to guide intensity level when exercising independently       Knowledge and understanding of Target Heart Rate Range (THRR) Yes       Intervention Provide education and explanation of THRR including how the numbers  were predicted and where they are located for reference       Expected Outcomes Short Term: Able to state/look up THRR;Short Term: Able to use daily as guideline for intensity in rehab;Long Term: Able to use THRR to govern intensity when exercising independently       Able to check pulse independently Yes       Intervention Provide education and demonstration on how to check pulse in carotid and radial arteries.;Review the importance of being able to check your own pulse for safety during independent exercise       Expected Outcomes Short Term: Able to explain why pulse checking is important during independent exercise;Long Term: Able  to check pulse independently and accurately       Understanding of Exercise Prescription Yes       Intervention Provide education, explanation, and written materials on patient's individual exercise prescription       Expected Outcomes Short Term: Able to explain program exercise prescription;Long Term: Able to explain home exercise prescription to exercise independently          Exercise Goals Re-Evaluation :  Exercise Goals Re-Evaluation     Row Name 07/04/23 1544 07/12/23 1155 07/24/23 1503         Exercise Goal Re-Evaluation   Exercise Goals Review Increase Physical Activity;Able to understand and use rate of perceived exertion (RPE) scale;Knowledge and understanding of Target Heart Rate Range (THRR);Understanding of Exercise Prescription;Increase Strength and Stamina;Able to check pulse independently Increase Physical Activity;Increase Strength and Stamina;Understanding of Exercise Prescription Increase Physical Activity;Increase Strength and Stamina;Understanding of Exercise Prescription     Comments Reviewed RPE and dyspnea scale, THR and program prescription with pt today.  Pt voiced understanding and was given a copy of goals to take home. Adit is off to a good start in the program. He did well on the treadmill at a speed of 2.5 mph with no incline. He  also did well on the XR at level 7 and used 3 lb hand weights for resistance training. We will continue to monitor his progress in the program. Jessiah has only attended one session since the last review. He continued to work on the treadmill at a speed of 2.5 mph with no incline. He also began using the rower and did well at level 2. We will continue to monitor his progress in the program.     Expected Outcomes Short: Use RPE daily to regulate intensity.  Long: Follow program prescription in THR. -- Short: Attend rehab more consistently. Long: Continue exercise to improve strength and stamina.        Discharge Exercise Prescription (Final Exercise Prescription Changes):  Exercise Prescription Changes - 07/24/23 1500       Response to Exercise   Blood Pressure (Admit) 142/60    Blood Pressure (Exercise) 170/64    Blood Pressure (Exit) 118/54    Heart Rate (Admit) 51 bpm    Heart Rate (Exercise) 98 bpm    Heart Rate (Exit) 64 bpm    Rating of Perceived Exertion (Exercise) 13    Symptoms none    Duration Continue with 30 min of aerobic exercise without signs/symptoms of physical distress.    Intensity THRR unchanged      Progression   Progression Continue to progress workloads to maintain intensity without signs/symptoms of physical distress.    Average METs 3.65      Resistance Training   Training Prescription Yes    Weight 3 lb    Reps 10-15      Interval Training   Interval Training No      Treadmill   MPH 2.5    Grade 0    Minutes 15    METs 2.91      Rower   Level 2    Watts 27    Minutes 15    METs 4.39      Oxygen   Maintain Oxygen Saturation 88% or higher          Nutrition:  Target Goals: Understanding of nutrition guidelines, daily intake of sodium 1500mg , cholesterol 200mg , calories 30% from fat and 7% or less from saturated fats, daily to have 5 or more  servings of fruits and vegetables.  Education: All About Nutrition: -Group instruction provided by  verbal, written material, interactive activities, discussions, models, and posters to present general guidelines for heart healthy nutrition including fat, fiber, MyPlate, the role of sodium in heart healthy nutrition, utilization of the nutrition label, and utilization of this knowledge for meal planning. Follow up email sent as well. Written material given at graduation. Flowsheet Row Cardiac Rehab from 07/04/2023 in [facility] Cardiac and Pulmonary Rehab  Education need identified 06/28/23  Date 07/04/23  Educator JG- part 1  Instruction Review Code 1- Verbalizes Understanding    Biometrics:  Pre Biometrics - 06/28/23 1614       Pre Biometrics   Height 5' 11.46 (1.815 m)    Weight 228 lb 8 oz (103.6 kg)    Waist Circumference 44 inches    Hip Circumference 43 inches    Waist to Hip Ratio 1.02 %    BMI (Calculated) 31.46    Single Leg Stand 11 seconds           Nutrition Therapy Plan and Nutrition Goals:  Nutrition Therapy & Goals - 07/09/23 1708       Nutrition Therapy   Diet Carb controlled, Cardiac, low Na    Protein (specify units) 90    Fiber 30 grams    Whole Grain Foods 3 servings    Saturated Fats 15 max. grams    Fruits and Vegetables 5 servings/day    Sodium 2 grams      Personal Nutrition Goals   Nutrition Goal Drink 48oz of plain water and reduce sprite zero    Personal Goal #2 Read labels and reduce sodium intake to below 2300mg . Ideally 1500mg  per day.    Personal Goal #3 Include more colorful produce, aim for 5-8 servings of fruits and veggies per day    Comments Patient drinking a lot of sprite zero and only about 20oz of water. Set goal to replace some of his sprite zero with more plain water. He has attended nutrition education class at rehab and has been reading labels to try and keep intake of sodium and saturated fat down below 1500mg  and 12g respectively. Provided mediterranean diet handout. Educated on types of fats, sources, and how to read labels.  Brainstormed meal and snack ideas with foods he likes and will focusing on protein carb pairing.      Intervention Plan   Intervention Prescribe, educate and counsel regarding individualized specific dietary modifications aiming towards targeted core components such as weight, hypertension, lipid management, diabetes, heart failure and other comorbidities.;Nutrition handout(s) given to patient.    Expected Outcomes Short Term Goal: Understand basic principles of dietary content, such as calories, fat, sodium, cholesterol and nutrients.;Short Term Goal: A plan has been developed with personal nutrition goals set during dietitian appointment.;Long Term Goal: Adherence to prescribed nutrition plan.          Nutrition Assessments:  MEDIFICTS Score Key: >=70 Need to make dietary changes  40-70 Heart Healthy Diet <= 40 Therapeutic Level Cholesterol Diet  Flowsheet Row Cardiac Rehab from 06/28/2023 in [facility] Cardiac and Pulmonary Rehab  Picture Your Plate Total Score on Admission 58   Picture Your Plate Scores: <59 Unhealthy dietary pattern with much room for improvement. 41-50 Dietary pattern unlikely to meet recommendations for good health and room for improvement. 51-60 More healthful dietary pattern, with some room for improvement.  >60 Healthy dietary pattern, although there may be some specific behaviors that could be improved.  Nutrition Goals Re-Evaluation:   Nutrition Goals Discharge (Final Nutrition Goals Re-Evaluation):   Psychosocial: Target Goals: Acknowledge presence or absence of significant depression and/or stress, maximize coping skills, provide positive support system. Participant is able to verbalize types and ability to use techniques and skills needed for reducing stress and depression.   Education: Stress, Anxiety, and Depression - Group verbal and visual presentation to define topics covered.  Reviews how body is impacted by stress, anxiety, and depression.  Also  discusses healthy ways to reduce stress and to treat/manage anxiety and depression.  Written material given at graduation.   Education: Sleep Hygiene -Provides group verbal and written instruction about how sleep can affect your health.  Define sleep hygiene, discuss sleep cycles and impact of sleep habits. Review good sleep hygiene tips.    Initial Review & Psychosocial Screening:  Initial Psych Review & Screening - 06/22/23 1109       Initial Review   Current issues with Current Stress Concerns    Source of Stress Concerns Occupation      Family Dynamics   Good Support System? Yes   family and friends     Barriers   Psychosocial barriers to participate in program There are no identifiable barriers or psychosocial needs.;The patient should benefit from training in stress management and relaxation.      Screening Interventions   Interventions Provide feedback about the scores to participant;Encouraged to exercise;To provide support and resources with identified psychosocial needs    Expected Outcomes Short Term goal: Utilizing psychosocial counselor, staff and physician to assist with identification of specific Stressors or current issues interfering with healing process. Setting desired goal for each stressor or current issue identified.;Long Term Goal: Stressors or current issues are controlled or eliminated.;Short Term goal: Identification and review with participant of any Quality of Life or Depression concerns found by scoring the questionnaire.;Long Term goal: The participant improves quality of Life and PHQ9 Scores as seen by post scores and/or verbalization of changes          Quality of Life Scores:   Quality of Life - 06/28/23 1615       Quality of Life   Select Quality of Life      Quality of Life Scores   Health/Function Pre 18.37 %    Socioeconomic Pre 21.43 %    Psych/Spiritual Pre 22.86 %    Family Pre 23.5 %    GLOBAL Pre 20.5 %         Scores of 19 and  below usually indicate a poorer quality of life in these areas.  A difference of  2-3 points is a clinically meaningful difference.  A difference of 2-3 points in the total score of the Quality of Life Index has been associated with significant improvement in overall quality of life, self-image, physical symptoms, and general health in studies assessing change in quality of life.  PHQ-9: Review Flowsheet       06/28/2023  Depression screen PHQ 2/9  Decreased Interest 0  Down, Depressed, Hopeless 0  PHQ - 2 Score 0  Altered sleeping 2  Tired, decreased energy 1  Change in appetite 0  Feeling bad or failure about yourself  0  Trouble concentrating 0  Moving slowly or fidgety/restless 0  Suicidal thoughts 0  PHQ-9 Score 3  Difficult doing work/chores Not difficult at all   Interpretation of Total Score  Total Score Depression Severity:  1-4 = Minimal depression, 5-9 = Mild depression, 10-14 = Moderate  depression, 15-19 = Moderately severe depression, 20-27 = Severe depression   Psychosocial Evaluation and Intervention:  Psychosocial Evaluation - 06/22/23 1115       Psychosocial Evaluation & Interventions   Interventions Encouraged to exercise with the program and follow exercise prescription;Stress management education;Relaxation education    Comments Mr. Loftin is coming to cardiac rehab after a stent placement. He states his biggest stress concern is his job. 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 His family and friends are emotional support, but he states he doesn't want to bother them too much about things. He wants to attend the program to learn more about exercising and nutrition.    Expected Outcomes Short: attend cardiac rehab for education and exercise Long; develop and maintain positive self care habits    Continue Psychosocial Services   Follow up required by staff          Psychosocial Re-Evaluation:   Psychosocial Discharge (Final Psychosocial Re-Evaluation):   Vocational Rehabilitation: Provide vocational rehab assistance to qualifying candidates.   Vocational Rehab Evaluation & Intervention:  Vocational Rehab - 06/22/23 1104       Initial Vocational Rehab Evaluation & Intervention   Assessment shows need for Vocational Rehabilitation No          Education: Education Goals: Education classes will be provided on a variety of topics geared toward better understanding of heart health and risk factor modification. Participant will state understanding/return demonstration of topics presented as noted by education test scores.  Learning Barriers/Preferences:  Learning Barriers/Preferences - 06/22/23 1107       Learning Barriers/Preferences   Learning Barriers None    Learning Preferences None          General Cardiac Education Topics:  AED/CPR: - Group verbal and written instruction with the use of models to demonstrate the basic use of the AED with the basic ABC's of resuscitation.   Anatomy and Cardiac Procedures: - Group verbal and visual presentation and models provide information about basic cardiac anatomy and function. Reviews the testing methods done to diagnose heart disease and the outcomes of the test results. Describes the treatment choices: Medical Management, Angioplasty, or Coronary Bypass Surgery for treating various heart conditions including Myocardial Infarction, Angina, Valve Disease, and Cardiac Arrhythmias.  Written material given at graduation.   Medication Safety: - Group verbal and visual instruction to review commonly prescribed medications for heart and lung disease. Reviews the medication, class of the drug, and side effects. Includes the steps to properly store meds and maintain the prescription regimen.  Written material given at graduation.   Intimacy: - Group verbal  instruction through game format to discuss how heart and lung disease can affect sexual intimacy. Written material given at graduation..   Know Your Numbers and Heart Failure: - Group verbal and visual instruction to discuss disease risk factors for cardiac and pulmonary disease and treatment options.  Reviews associated critical values for Overweight/Obesity, Hypertension, Cholesterol, and Diabetes.  Discusses basics of heart failure: signs/symptoms and treatments.  Introduces Heart Failure Zone chart for action plan for heart failure.  Written material given at graduation.   Infection Prevention: - Provides verbal and written material to individual with discussion of infection control including proper hand washing and proper equipment cleaning during exercise session. Flowsheet Row Cardiac Rehab from 07/04/2023 in  [facility]  Date 06/28/23  Educator MB  Instruction Review Code 1- Verbalizes Understanding    Falls Prevention: - Provides verbal and written material to individual with discussion of falls prevention and safety. Flowsheet Row Cardiac Rehab from 07/04/2023 in [facility]  Date 06/28/23  Educator MB  Instruction Review Code 1- Verbalizes Understanding    Other: -Provides group and verbal instruction on various topics (see comments)   Knowledge Questionnaire Score:  Knowledge Questionnaire Score - 06/28/23 1616       Knowledge Questionnaire Score   Pre Score 15/18          Core Components/Risk Factors/Patient Goals at Admission:  Personal Goals and Risk Factors at Admission - 06/28/23 1617       Core Components/Risk Factors/Patient Goals on Admission    Weight Management Yes;Weight Loss    Intervention Weight Management: Develop a combined nutrition and exercise program designed to reach desired caloric intake, while maintaining appropriate intake of nutrient and fiber, sodium and fats, and appropriate energy expenditure  required for the weight goal.;Weight Management: Provide education and appropriate resources to help participant work on and attain dietary goals.;Weight Management/Obesity: Establish reasonable short term and long term weight goals.    Admit Weight 228 lb 8 oz (103.6 kg)    Goal Weight: Short Term 220 lb (99.8 kg)    Goal Weight: Long Term 210 lb (95.3 kg)    Expected Outcomes Short Term: Continue to assess and modify interventions until short term weight is achieved;Long Term: Adherence to nutrition and physical activity/exercise program aimed toward attainment of established weight goal;Weight Loss: Understanding of general recommendations for a balanced deficit meal plan, which promotes 1-2 lb weight loss per week and includes a negative energy balance of [phone] kcal/d;Understanding of distribution of calorie intake throughout the day with the consumption of 4-5 meals/snacks;Understanding recommendations for meals to include 15-35% energy as protein, 25-35% energy from fat, 35-60% energy from carbohydrates, less than 200mg  of dietary cholesterol, 20-35 gm of total fiber daily    Hypertension Yes    Intervention Provide education on lifestyle modifcations including regular physical activity/exercise, weight management, moderate sodium restriction and increased consumption of fresh fruit, vegetables, and low fat dairy, alcohol moderation, and smoking cessation.;Monitor prescription use compliance.    Expected Outcomes Long Term: Maintenance of blood pressure at goal levels.;Short Term: Continued assessment and intervention until BP is < 140/57mm HG in hypertensive participants. < 130/56mm HG in hypertensive participants with diabetes, heart failure or chronic kidney disease.    Lipids Yes    Intervention Provide education and support for participant on nutrition & aerobic/resistive exercise along with prescribed medications to achieve LDL 70mg , HDL >40mg .    Expected Outcomes Short Term: Participant  states understanding of desired cholesterol values and is compliant with medications prescribed. Participant is following exercise prescription and nutrition guidelines.;Long Term: Cholesterol controlled with medications as prescribed, with individualized exercise RX and with personalized nutrition plan. Value goals: LDL < 70mg , HDL > 40 mg.          Education:Diabetes - Individual verbal and written instruction to review signs/symptoms of diabetes, desired ranges of glucose level fasting, after meals and with exercise. Acknowledge that pre and post exercise glucose checks will be done for 3 sessions at entry of program.   Core Components/Risk Factors/Patient Goals Review:    Core Components/Risk Factors/Patient Goals at Discharge (Final Review):    ITP Comments:  ITP Comments     Row  Name 06/22/23 1115 06/28/23 1608 07/04/23 1542 07/25/23 1102     ITP Comments Initial phone call completed. Diagnosis can be found in [facility] 5/20. EP Orientation scheduled for Thursday 6/5 at 2pm. Completed and gym orientation for cardiac rehab. Initial ITP created and sent for review to Dr. Oneil Pinal, Medical Director. First full day of exercise!  Patient was oriented to gym and equipment including functions, settings, policies, and procedures.  Patient's individual exercise prescription and treatment plan were reviewed.  All starting workloads were established based on the results of the 6 minute walk test done at initial orientation visit.  The plan for exercise progression was also introduced and progression will be customized based on patient's performance and goals. 30 Day review completed. Medical Director ITP review done, changes made as directed, and signed approval by Medical Director. Has not attended consistently       Comments: 30 day review

## 2023-07-26 ENCOUNTER — Encounter

## 2023-07-30 ENCOUNTER — Encounter

## 2023-08-01 ENCOUNTER — Encounter

## 2023-08-02 ENCOUNTER — Encounter

## 2023-08-02 ENCOUNTER — Encounter: Payer: Self-pay | Admitting: *Deleted

## 2023-08-02 DIAGNOSIS — Z955 Presence of coronary angioplasty implant and graft: Secondary | ICD-10-CM

## 2023-08-02 NOTE — Progress Notes (Signed)
 Early Discharge Summary   Charles Ibarra DOB: 2065/04/01   [name] wishes to be discharged at this time from the program due to scheduling conflicts at work. He is attending his local gym. He completed 4 of 36 sessions    6 Minute Walk     Row Name 06/28/23 1608         6 Minute Walk   Phase Initial     Distance 1365 feet     Walk Time 6 minutes     # of Rest Breaks 0     MPH 2.59     METS 3.65     RPE 10     Perceived Dyspnea  0     VO2 Peak 12.78     Symptoms Yes (comment)     Comments Leg/calf pain 4/10 while walking, 7/10 at end of test     Resting HR 58 bpm     Resting BP 134/64     Resting Oxygen Saturation  95 %     Exercise Oxygen Saturation  during 6 min walk 96 %     Max Ex. HR 89 bpm     Max Ex. BP 182/62     2 Minute Post BP 164/70

## 2023-08-02 NOTE — Progress Notes (Signed)
 Cardiac Individual Treatment Plan  Patient Details  Name: Charles Ibarra MRN: [id number] Date of Birth: 09-Jun-1965 Referring Provider:   Flowsheet Row Cardiac Rehab from 06/28/2023 in [facility] Cardiac and Pulmonary Rehab  Referring Provider Ammon Blunt, MD    Initial Encounter Date:  Flowsheet Row Cardiac Rehab from 06/28/2023 in [facility] Cardiac and Pulmonary Rehab  Date 06/28/23    Visit Diagnosis: Status post coronary artery stent placement  Patient's Home Medications on Admission:  Current Outpatient Medications:    aspirin  EC 81 MG tablet, Take 81 mg by mouth daily., Disp: , Rfl:    atorvastatin  (LIPITOR) 80 MG tablet, Take 80 mg by mouth at bedtime., Disp: , Rfl:    carvedilol  (COREG ) 25 MG tablet, Take 12.5 mg by mouth 2 (two) times daily with a meal., Disp: , Rfl:    clopidogrel  (PLAVIX ) 75 MG tablet, Take 1 tablet by mouth once daily, Disp: 90 tablet, Rfl: 1   colchicine 0.6 MG tablet, Take 0.6 mg by mouth daily as needed (Gout)., Disp: , Rfl:    cyanocobalamin (VITAMIN B12) 1000 MCG tablet, Take 1,000 mcg by mouth daily. (Patient not taking: Reported on 06/22/2023), Disp: , Rfl:    ezetimibe (ZETIA) 10 MG tablet, Take 10 mg by mouth daily., Disp: , Rfl:    losartan  (COZAAR ) 100 MG tablet, Take 100 mg by mouth daily., Disp: , Rfl:    nitroGLYCERIN  (NITROSTAT ) 0.3 MG SL tablet, Place 1 tablet (0.3 mg total) under the tongue every 5 (five) minutes as needed for chest pain., Disp: 100 tablet, Rfl: 3   tadalafil (CIALIS) 20 MG tablet, Take 20 mg by mouth daily as needed for erectile dysfunction., Disp: , Rfl:    torsemide  (DEMADEX ) 20 MG tablet, Take 20 mg by mouth daily., Disp: , Rfl:   Past Medical History: Past Medical History:  Diagnosis Date   Alcohol abuse    Aortic stenosis    Aortic stenosis    Atherosclerotic PVD with intermittent claudication (HCC)    Chronic kidney disease, stage 3 (moderate)    Coronary artery disease involving native coronary artery of native  heart with angina pectoris (HCC)    Diabetes mellitus without complication (HCC)    HTN (hypertension)    Hyperlipidemia    Ischemic cardiomyopathy    S/P CABG (coronary artery bypass graft)     Tobacco Use: Social History   Tobacco Use  Smoking Status Former   Current packs/day: 0.00   Types: Cigarettes   Quit date: 04/02/2010   Years since quitting: 13.3  Smokeless Tobacco Never    Labs: Review Flowsheet        No data to display           Exercise Target Goals: Exercise Program Goal: Individual exercise prescription set using results from initial 6 min walk test and THRR while considering  patient's activity barriers and safety.   Exercise Prescription Goal: Initial exercise prescription builds to 30-45 minutes a day of aerobic activity, 2-3 days per week.  Home exercise guidelines will be given to patient during program as part of exercise prescription that the participant will acknowledge.   Education: Aerobic Exercise: - Group verbal and visual presentation on the components of exercise prescription. Introduces F.I.T.T principle from ACSM for exercise prescriptions.  Reviews F.I.T.T. principles of aerobic exercise including progression. Written material given at graduation. Flowsheet Row Cardiac Rehab from 07/04/2023 in [facility] Cardiac and Pulmonary Rehab  Education need identified 06/28/23    Education: Resistance Exercise: - Group  verbal and visual presentation on the components of exercise prescription. Introduces F.I.T.T principle from ACSM for exercise prescriptions  Reviews F.I.T.T. principles of resistance exercise including progression. Written material given at graduation.    Education: Exercise & Equipment Safety: - Individual verbal instruction and demonstration of equipment use and safety with use of the equipment. Flowsheet Row Cardiac Rehab from 07/04/2023 in [facility] Cardiac and Pulmonary Rehab  Date 06/28/23  Educator MB  Instruction Review Code 1-  Verbalizes Understanding    Education: Exercise Physiology & General Exercise Guidelines: - Group verbal and written instruction with models to review the exercise physiology of the cardiovascular system and associated critical values. Provides general exercise guidelines with specific guidelines to those with heart or lung disease.    Education: Flexibility, Balance, Mind/Body Relaxation: - Group verbal and visual presentation with interactive activity on the components of exercise prescription. Introduces F.I.T.T principle from ACSM for exercise prescriptions. Reviews F.I.T.T. principles of flexibility and balance exercise training including progression. Also discusses the mind body connection.  Reviews various relaxation techniques to help reduce and manage stress (i.e. Deep breathing, progressive muscle relaxation, and visualization). Balance handout provided to take home. Written material given at graduation.   Activity Barriers & Risk Stratification:  Activity Barriers & Cardiac Risk Stratification - 06/28/23 1610       Activity Barriers & Cardiac Risk Stratification   Activity Barriers Other (comment);Joint Problems    Comments PAD- worse in one leg (Left), Left shoulder    Cardiac Risk Stratification High          6 Minute Walk:  6 Minute Walk     Row Name 06/28/23 1608         6 Minute Walk   Phase Initial     Distance 1365 feet     Walk Time 6 minutes     # of Rest Breaks 0     MPH 2.59     METS 3.65     RPE 10     Perceived Dyspnea  0     VO2 Peak 12.78     Symptoms Yes (comment)     Comments Leg/calf pain 4/10 while walking, 7/10 at end of test     Resting HR 58 bpm     Resting BP 134/64     Resting Oxygen Saturation  95 %     Exercise Oxygen Saturation  during 6 min walk 96 %     Max Ex. HR 89 bpm     Max Ex. BP 182/62     2 Minute Post BP 164/70        Oxygen Initial Assessment:   Oxygen Re-Evaluation:   Oxygen Discharge (Final Oxygen  Re-Evaluation):   Initial Exercise Prescription:  Initial Exercise Prescription - 06/28/23 1600       Date of Initial Exercise RX and Referring Provider   Date 06/28/23    Referring Provider Ammon Blunt, MD      Oxygen   Maintain Oxygen Saturation 88% or higher      Treadmill   MPH 2.5    Grade 0    Minutes 15    METs 2.91      REL-XR   Level 3    Speed 50    Minutes 15    METs 3.65      T5 Nustep   Level 3    SPM 80    Minutes 15    METs 3.65      Rower  Level 2    Watts 25    Minutes 15    METs 3.65      Prescription Details   Frequency (times per week) 3    Duration Progress to 30 minutes of continuous aerobic without signs/symptoms of physical distress      Intensity   THRR 40-80% of Max Heartrate 99-140    Ratings of Perceived Exertion 11-13    Perceived Dyspnea 0-4      Progression   Progression Continue to progress workloads to maintain intensity without signs/symptoms of physical distress.      Resistance Training   Training Prescription Yes    Weight 12 lb    Reps 10-15          Perform Capillary Blood Glucose checks as needed.  Exercise Prescription Changes:   Exercise Prescription Changes     Row Name 06/28/23 1600 07/12/23 1100 07/24/23 1500         Response to Exercise   Blood Pressure (Admit) 134/64 124/68 142/60     Blood Pressure (Exercise) 182/62 162/76 170/64     Blood Pressure (Exit) 164/70 114/58 118/54     Heart Rate (Admit) 58 bpm 56 bpm 51 bpm     Heart Rate (Exercise) 89 bpm 99 bpm 98 bpm     Heart Rate (Exit) 55 bpm 68 bpm 64 bpm     Oxygen Saturation (Admit) 95 % -- --     Oxygen Saturation (Exercise) 96 % -- --     Oxygen Saturation (Exit) 97 % -- --     Rating of Perceived Exertion (Exercise) 10 13 13      Perceived Dyspnea (Exercise) 0 -- --     Symptoms leg/calf pain 4/10 walking, 7/10 at end of test none none     Comments results First two days of exercise --     Duration -- Continue with  30 min of aerobic exercise without signs/symptoms of physical distress. Continue with 30 min of aerobic exercise without signs/symptoms of physical distress.     Intensity THRR New THRR unchanged THRR unchanged       Progression   Progression -- Continue to progress workloads to maintain intensity without signs/symptoms of physical distress. Continue to progress workloads to maintain intensity without signs/symptoms of physical distress.     Average METs 3.65 3.31 3.65       Resistance Training   Training Prescription -- Yes Yes     Weight -- 3 lb 3 lb     Reps -- 10-15 10-15       Interval Training   Interval Training -- No No       Treadmill   MPH -- 2.5 2.5     Grade -- 0 0     Minutes -- 15 15     METs -- 2.91 2.91       REL-XR   Level -- 7 --     Minutes -- 15 --     METs -- 4.1 --       Rower   Level -- -- 2     Watts -- -- 27     Minutes -- -- 15     METs -- -- 4.39       Oxygen   Maintain Oxygen Saturation -- 88% or higher 88% or higher        Exercise Comments:   Exercise Comments     Row Name 07/04/23 1542  Exercise Comments First full day of exercise!  Patient was oriented to gym and equipment including functions, settings, policies, and procedures.  Patient's individual exercise prescription and treatment plan were reviewed.  All starting workloads were established based on the results of the 6 minute walk test done at initial orientation visit.  The plan for exercise progression was also introduced and progression will be customized based on patient's performance and goals.          Exercise Goals and Review:   Exercise Goals     Row Name 06/28/23 1614             Exercise Goals   Increase Physical Activity Yes       Intervention Provide advice, education, support and counseling about physical activity/exercise needs.;Develop an individualized exercise prescription for aerobic and resistive training based on initial evaluation  findings, risk stratification, comorbidities and participant's personal goals.       Expected Outcomes Short Term: Attend rehab on a regular basis to increase amount of physical activity.;Long Term: Add in home exercise to make exercise part of routine and to increase amount of physical activity.;Long Term: Exercising regularly at least 3-5 days a week.       Increase Strength and Stamina Yes       Intervention Provide advice, education, support and counseling about physical activity/exercise needs.;Develop an individualized exercise prescription for aerobic and resistive training based on initial evaluation findings, risk stratification, comorbidities and participant's personal goals.       Expected Outcomes Short Term: Increase workloads from initial exercise prescription for resistance, speed, and METs.;Short Term: Perform resistance training exercises routinely during rehab and add in resistance training at home;Long Term: Improve cardiorespiratory fitness, muscular endurance and strength as measured by increased METs and functional capacity ( )       Able to understand and use rate of perceived exertion (RPE) scale Yes       Intervention Provide education and explanation on how to use RPE scale       Expected Outcomes Short Term: Able to use RPE daily in rehab to express subjective intensity level;Long Term:  Able to use RPE to guide intensity level when exercising independently       Able to understand and use Dyspnea scale Yes       Intervention Provide education and explanation on how to use Dyspnea scale       Expected Outcomes Short Term: Able to use Dyspnea scale daily in rehab to express subjective sense of shortness of breath during exertion;Long Term: Able to use Dyspnea scale to guide intensity level when exercising independently       Knowledge and understanding of Target Heart Rate Range (THRR) Yes       Intervention Provide education and explanation of THRR including how the numbers  were predicted and where they are located for reference       Expected Outcomes Short Term: Able to state/look up THRR;Short Term: Able to use daily as guideline for intensity in rehab;Long Term: Able to use THRR to govern intensity when exercising independently       Able to check pulse independently Yes       Intervention Provide education and demonstration on how to check pulse in carotid and radial arteries.;Review the importance of being able to check your own pulse for safety during independent exercise       Expected Outcomes Short Term: Able to explain why pulse checking is important during independent exercise;Long Term: Able  to check pulse independently and accurately       Understanding of Exercise Prescription Yes       Intervention Provide education, explanation, and written materials on patient's individual exercise prescription       Expected Outcomes Short Term: Able to explain program exercise prescription;Long Term: Able to explain home exercise prescription to exercise independently          Exercise Goals Re-Evaluation :  Exercise Goals Re-Evaluation     Row Name 07/04/23 1544 07/12/23 1155 07/24/23 1503         Exercise Goal Re-Evaluation   Exercise Goals Review Increase Physical Activity;Able to understand and use rate of perceived exertion (RPE) scale;Knowledge and understanding of Target Heart Rate Range (THRR);Understanding of Exercise Prescription;Increase Strength and Stamina;Able to check pulse independently Increase Physical Activity;Increase Strength and Stamina;Understanding of Exercise Prescription Increase Physical Activity;Increase Strength and Stamina;Understanding of Exercise Prescription     Comments Reviewed RPE and dyspnea scale, THR and program prescription with pt today.  Pt voiced understanding and was given a copy of goals to take home. Muaad is off to a good start in the program. He did well on the treadmill at a speed of 2.5 mph with no incline. He  also did well on the XR at level 7 and used 3 lb hand weights for resistance training. We will continue to monitor his progress in the program. Justo has only attended one session since the last review. He continued to work on the treadmill at a speed of 2.5 mph with no incline. He also began using the rower and did well at level 2. We will continue to monitor his progress in the program.     Expected Outcomes Short: Use RPE daily to regulate intensity.  Long: Follow program prescription in THR. -- Short: Attend rehab more consistently. Long: Continue exercise to improve strength and stamina.        Discharge Exercise Prescription (Final Exercise Prescription Changes):  Exercise Prescription Changes - 07/24/23 1500       Response to Exercise   Blood Pressure (Admit) 142/60    Blood Pressure (Exercise) 170/64    Blood Pressure (Exit) 118/54    Heart Rate (Admit) 51 bpm    Heart Rate (Exercise) 98 bpm    Heart Rate (Exit) 64 bpm    Rating of Perceived Exertion (Exercise) 13    Symptoms none    Duration Continue with 30 min of aerobic exercise without signs/symptoms of physical distress.    Intensity THRR unchanged      Progression   Progression Continue to progress workloads to maintain intensity without signs/symptoms of physical distress.    Average METs 3.65      Resistance Training   Training Prescription Yes    Weight 3 lb    Reps 10-15      Interval Training   Interval Training No      Treadmill   MPH 2.5    Grade 0    Minutes 15    METs 2.91      Rower   Level 2    Watts 27    Minutes 15    METs 4.39      Oxygen   Maintain Oxygen Saturation 88% or higher          Nutrition:  Target Goals: Understanding of nutrition guidelines, daily intake of sodium 1500mg , cholesterol 200mg , calories 30% from fat and 7% or less from saturated fats, daily to have 5 or more  servings of fruits and vegetables.  Education: All About Nutrition: -Group instruction provided by  verbal, written material, interactive activities, discussions, models, and posters to present general guidelines for heart healthy nutrition including fat, fiber, MyPlate, the role of sodium in heart healthy nutrition, utilization of the nutrition label, and utilization of this knowledge for meal planning. Follow up email sent as well. Written material given at graduation. Flowsheet Row Cardiac Rehab from 07/04/2023 in [facility] Cardiac and Pulmonary Rehab  Education need identified 06/28/23  Date 07/04/23  Educator JG- part 1  Instruction Review Code 1- Verbalizes Understanding    Biometrics:  Pre Biometrics - 06/28/23 1614       Pre Biometrics   Height 5' 11.46 (1.815 m)    Weight 228 lb 8 oz (103.6 kg)    Waist Circumference 44 inches    Hip Circumference 43 inches    Waist to Hip Ratio 1.02 %    BMI (Calculated) 31.46    Single Leg Stand 11 seconds           Nutrition Therapy Plan and Nutrition Goals:  Nutrition Therapy & Goals - 07/09/23 1708       Nutrition Therapy   Diet Carb controlled, Cardiac, low Na    Protein (specify units) 90    Fiber 30 grams    Whole Grain Foods 3 servings    Saturated Fats 15 max. grams    Fruits and Vegetables 5 servings/day    Sodium 2 grams      Personal Nutrition Goals   Nutrition Goal Drink 48oz of plain water and reduce sprite zero    Personal Goal #2 Read labels and reduce sodium intake to below 2300mg . Ideally 1500mg  per day.    Personal Goal #3 Include more colorful produce, aim for 5-8 servings of fruits and veggies per day    Comments Patient drinking a lot of sprite zero and only about 20oz of water. Set goal to replace some of his sprite zero with more plain water. He has attended nutrition education class at rehab and has been reading labels to try and keep intake of sodium and saturated fat down below 1500mg  and 12g respectively. Provided mediterranean diet handout. Educated on types of fats, sources, and how to read labels.  Brainstormed meal and snack ideas with foods he likes and will focusing on protein carb pairing.      Intervention Plan   Intervention Prescribe, educate and counsel regarding individualized specific dietary modifications aiming towards targeted core components such as weight, hypertension, lipid management, diabetes, heart failure and other comorbidities.;Nutrition handout(s) given to patient.    Expected Outcomes Short Term Goal: Understand basic principles of dietary content, such as calories, fat, sodium, cholesterol and nutrients.;Short Term Goal: A plan has been developed with personal nutrition goals set during dietitian appointment.;Long Term Goal: Adherence to prescribed nutrition plan.          Nutrition Assessments:  MEDIFICTS Score Key: >=70 Need to make dietary changes  40-70 Heart Healthy Diet <= 40 Therapeutic Level Cholesterol Diet  Flowsheet Row Cardiac Rehab from 06/28/2023 in [facility] Cardiac and Pulmonary Rehab  Picture Your Plate Total Score on Admission 58   Picture Your Plate Scores: <59 Unhealthy dietary pattern with much room for improvement. 41-50 Dietary pattern unlikely to meet recommendations for good health and room for improvement. 51-60 More healthful dietary pattern, with some room for improvement.  >60 Healthy dietary pattern, although there may be some specific behaviors that could be improved.  Nutrition Goals Re-Evaluation:   Nutrition Goals Discharge (Final Nutrition Goals Re-Evaluation):   Psychosocial: Target Goals: Acknowledge presence or absence of significant depression and/or stress, maximize coping skills, provide positive support system. Participant is able to verbalize types and ability to use techniques and skills needed for reducing stress and depression.   Education: Stress, Anxiety, and Depression - Group verbal and visual presentation to define topics covered.  Reviews how body is impacted by stress, anxiety, and depression.  Also  discusses healthy ways to reduce stress and to treat/manage anxiety and depression.  Written material given at graduation.   Education: Sleep Hygiene -Provides group verbal and written instruction about how sleep can affect your health.  Define sleep hygiene, discuss sleep cycles and impact of sleep habits. Review good sleep hygiene tips.    Initial Review & Psychosocial Screening:  Initial Psych Review & Screening - 06/22/23 1109       Initial Review   Current issues with Current Stress Concerns    Source of Stress Concerns Occupation      Family Dynamics   Good Support System? Yes   family and friends     Barriers   Psychosocial barriers to participate in program There are no identifiable barriers or psychosocial needs.;The patient should benefit from training in stress management and relaxation.      Screening Interventions   Interventions Provide feedback about the scores to participant;Encouraged to exercise;To provide support and resources with identified psychosocial needs    Expected Outcomes Short Term goal: Utilizing psychosocial counselor, staff and physician to assist with identification of specific Stressors or current issues interfering with healing process. Setting desired goal for each stressor or current issue identified.;Long Term Goal: Stressors or current issues are controlled or eliminated.;Short Term goal: Identification and review with participant of any Quality of Life or Depression concerns found by scoring the questionnaire.;Long Term goal: The participant improves quality of Life and PHQ9 Scores as seen by post scores and/or verbalization of changes          Quality of Life Scores:   Quality of Life - 06/28/23 1615       Quality of Life   Select Quality of Life      Quality of Life Scores   Health/Function Pre 18.37 %    Socioeconomic Pre 21.43 %    Psych/Spiritual Pre 22.86 %    Family Pre 23.5 %    GLOBAL Pre 20.5 %         Scores of 19 and  below usually indicate a poorer quality of life in these areas.  A difference of  2-3 points is a clinically meaningful difference.  A difference of 2-3 points in the total score of the Quality of Life Index has been associated with significant improvement in overall quality of life, self-image, physical symptoms, and general health in studies assessing change in quality of life.  PHQ-9: Review Flowsheet       06/28/2023  Depression screen PHQ 2/9  Decreased Interest 0  Down, Depressed, Hopeless 0  PHQ - 2 Score 0  Altered sleeping 2  Tired, decreased energy 1  Change in appetite 0  Feeling bad or failure about yourself  0  Trouble concentrating 0  Moving slowly or fidgety/restless 0  Suicidal thoughts 0  PHQ-9 Score 3  Difficult doing work/chores Not difficult at all   Interpretation of Total Score  Total Score Depression Severity:  1-4 = Minimal depression, 5-9 = Mild depression, 10-14 = Moderate  depression, 15-19 = Moderately severe depression, 20-27 = Severe depression   Psychosocial Evaluation and Intervention:  Psychosocial Evaluation - 06/22/23 1115       Psychosocial Evaluation & Interventions   Interventions Encouraged to exercise with the program and follow exercise prescription;Stress management education;Relaxation education    Comments Mr. Meyerhoff is coming to cardiac rehab after a stent placement. He states his biggest stress concern is his job. 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 His family and friends are emotional support, but he states he doesn't want to bother them too much about things. He wants to attend the program to learn more about exercising and nutrition.    Expected Outcomes Short: attend cardiac rehab for education and exercise Long; develop and maintain positive self care habits    Continue Psychosocial Services   Follow up required by staff          Psychosocial Re-Evaluation:   Psychosocial Discharge (Final Psychosocial Re-Evaluation):   Vocational Rehabilitation: Provide vocational rehab assistance to qualifying candidates.   Vocational Rehab Evaluation & Intervention:  Vocational Rehab - 06/22/23 1104       Initial Vocational Rehab Evaluation & Intervention   Assessment shows need for Vocational Rehabilitation No          Education: Education Goals: Education classes will be provided on a variety of topics geared toward better understanding of heart health and risk factor modification. Participant will state understanding/return demonstration of topics presented as noted by education test scores.  Learning Barriers/Preferences:  Learning Barriers/Preferences - 06/22/23 1107       Learning Barriers/Preferences   Learning Barriers None    Learning Preferences None          General Cardiac Education Topics:  AED/CPR: - Group verbal and written instruction with the use of models to demonstrate the basic use of the AED with the basic ABC's of resuscitation.   Anatomy and Cardiac Procedures: - Group verbal and visual presentation and models provide information about basic cardiac anatomy and function. Reviews the testing methods done to diagnose heart disease and the outcomes of the test results. Describes the treatment choices: Medical Management, Angioplasty, or Coronary Bypass Surgery for treating various heart conditions including Myocardial Infarction, Angina, Valve Disease, and Cardiac Arrhythmias.  Written material given at graduation.   Medication Safety: - Group verbal and visual instruction to review commonly prescribed medications for heart and lung disease. Reviews the medication, class of the drug, and side effects. Includes the steps to properly store meds and maintain the prescription regimen.  Written material given at graduation.   Intimacy: - Group verbal  instruction through game format to discuss how heart and lung disease can affect sexual intimacy. Written material given at graduation..   Know Your Numbers and Heart Failure: - Group verbal and visual instruction to discuss disease risk factors for cardiac and pulmonary disease and treatment options.  Reviews associated critical values for Overweight/Obesity, Hypertension, Cholesterol, and Diabetes.  Discusses basics of heart failure: signs/symptoms and treatments.  Introduces Heart Failure Zone chart for action plan for heart failure.  Written material given at graduation.   Infection Prevention: - Provides verbal and written material to individual with discussion of infection control including proper hand washing and proper equipment cleaning during exercise session. Flowsheet Row Cardiac Rehab from 07/04/2023 in  [facility]  Date 06/28/23  Educator MB  Instruction Review Code 1- Verbalizes Understanding    Falls Prevention: - Provides verbal and written material to individual with discussion of falls prevention and safety. Flowsheet Row Cardiac Rehab from 07/04/2023 in [facility]  Date 06/28/23  Educator MB  Instruction Review Code 1- Verbalizes Understanding    Other: -Provides group and verbal instruction on various topics (see comments)   Knowledge Questionnaire Score:  Knowledge Questionnaire Score - 06/28/23 1616       Knowledge Questionnaire Score   Pre Score 15/18          Core Components/Risk Factors/Patient Goals at Admission:  Personal Goals and Risk Factors at Admission - 06/28/23 1617       Core Components/Risk Factors/Patient Goals on Admission    Weight Management Yes;Weight Loss    Intervention Weight Management: Develop a combined nutrition and exercise program designed to reach desired caloric intake, while maintaining appropriate intake of nutrient and fiber, sodium and fats, and appropriate energy expenditure  required for the weight goal.;Weight Management: Provide education and appropriate resources to help participant work on and attain dietary goals.;Weight Management/Obesity: Establish reasonable short term and long term weight goals.    Admit Weight 228 lb 8 oz (103.6 kg)    Goal Weight: Short Term 220 lb (99.8 kg)    Goal Weight: Long Term 210 lb (95.3 kg)    Expected Outcomes Short Term: Continue to assess and modify interventions until short term weight is achieved;Long Term: Adherence to nutrition and physical activity/exercise program aimed toward attainment of established weight goal;Weight Loss: Understanding of general recommendations for a balanced deficit meal plan, which promotes 1-2 lb weight loss per week and includes a negative energy balance of [phone] kcal/d;Understanding of distribution of calorie intake throughout the day with the consumption of 4-5 meals/snacks;Understanding recommendations for meals to include 15-35% energy as protein, 25-35% energy from fat, 35-60% energy from carbohydrates, less than 200mg  of dietary cholesterol, 20-35 gm of total fiber daily    Hypertension Yes    Intervention Provide education on lifestyle modifcations including regular physical activity/exercise, weight management, moderate sodium restriction and increased consumption of fresh fruit, vegetables, and low fat dairy, alcohol moderation, and smoking cessation.;Monitor prescription use compliance.    Expected Outcomes Long Term: Maintenance of blood pressure at goal levels.;Short Term: Continued assessment and intervention until BP is < 140/42mm HG in hypertensive participants. < 130/15mm HG in hypertensive participants with diabetes, heart failure or chronic kidney disease.    Lipids Yes    Intervention Provide education and support for participant on nutrition & aerobic/resistive exercise along with prescribed medications to achieve LDL 70mg , HDL >40mg .    Expected Outcomes Short Term: Participant  states understanding of desired cholesterol values and is compliant with medications prescribed. Participant is following exercise prescription and nutrition guidelines.;Long Term: Cholesterol controlled with medications as prescribed, with individualized exercise RX and with personalized nutrition plan. Value goals: LDL < 70mg , HDL > 40 mg.          Education:Diabetes - Individual verbal and written instruction to review signs/symptoms of diabetes, desired ranges of glucose level fasting, after meals and with exercise. Acknowledge that pre and post exercise glucose checks will be done for 3 sessions at entry of program.   Core Components/Risk Factors/Patient Goals Review:    Core Components/Risk Factors/Patient Goals at Discharge (Final Review):    ITP Comments:  ITP Comments     Row  Name 06/22/23 1115 06/28/23 1608 07/04/23 1542 07/25/23 1102 08/02/23 1152   ITP Comments Initial phone call completed. Diagnosis [facility] in [facility]. EP Orientation scheduled for Thursday 6/5 at 2pm. Completed and gym orientation for cardiac rehab. Initial ITP created and sent for review to Dr. Oneil Pinal, Medical Director. First full day of exercise!  Patient was oriented to gym and equipment including functions, settings, policies, and procedures.  Patient's individual exercise prescription and treatment plan were reviewed.  All starting workloads were established based on the results of the 6 minute walk test done at initial orientation visit.  The plan for exercise progression was also introduced and progression will be customized based on patient's performance and goals. 30 Day review completed. Medical Director ITP review done, changes made as directed, and signed approval by Medical Director. Has not attended consistently Clinton wishes to be discharged at this time from the program due to scheduling conflicts at work. He is attending his local gym. He completed 4 of 36 sessions      Comments:  Early Discharge ITP

## 2023-08-06 ENCOUNTER — Encounter

## 2023-08-08 ENCOUNTER — Encounter

## 2023-08-09 ENCOUNTER — Encounter

## 2023-08-13 ENCOUNTER — Encounter

## 2023-08-15 ENCOUNTER — Encounter

## 2023-08-16 ENCOUNTER — Encounter

## 2023-08-20 ENCOUNTER — Encounter

## 2023-08-22 ENCOUNTER — Encounter

## 2023-08-23 ENCOUNTER — Encounter

## 2023-08-27 ENCOUNTER — Encounter

## 2023-08-29 ENCOUNTER — Encounter

## 2023-08-30 ENCOUNTER — Encounter

## 2023-09-03 ENCOUNTER — Encounter

## 2023-09-05 ENCOUNTER — Encounter

## 2023-09-06 ENCOUNTER — Encounter

## 2023-09-10 ENCOUNTER — Encounter

## 2023-09-12 ENCOUNTER — Encounter

## 2023-09-13 ENCOUNTER — Encounter

## 2023-09-17 ENCOUNTER — Encounter

## 2023-09-19 ENCOUNTER — Encounter

## 2023-09-20 ENCOUNTER — Encounter

## 2023-09-26 ENCOUNTER — Encounter

## 2023-09-27 ENCOUNTER — Encounter

## 2023-09-27 ENCOUNTER — Other Ambulatory Visit (INDEPENDENT_AMBULATORY_CARE_PROVIDER_SITE_OTHER): Payer: Self-pay | Admitting: Vascular Surgery

## 2023-09-27 DIAGNOSIS — I70219 Atherosclerosis of native arteries of extremities with intermittent claudication, unspecified extremity: Secondary | ICD-10-CM

## 2023-09-28 ENCOUNTER — Ambulatory Visit (INDEPENDENT_AMBULATORY_CARE_PROVIDER_SITE_OTHER): Admitting: Vascular Surgery

## 2023-09-28 ENCOUNTER — Other Ambulatory Visit (INDEPENDENT_AMBULATORY_CARE_PROVIDER_SITE_OTHER)

## 2023-09-28 ENCOUNTER — Encounter (INDEPENDENT_AMBULATORY_CARE_PROVIDER_SITE_OTHER): Payer: Self-pay | Admitting: Vascular Surgery

## 2023-09-28 VITALS — BP 139/80 | HR 58 | Ht 72.0 in | Wt 230.5 lb

## 2023-09-28 DIAGNOSIS — N183 Chronic kidney disease, stage 3 unspecified: Secondary | ICD-10-CM

## 2023-09-28 DIAGNOSIS — E1122 Type 2 diabetes mellitus with diabetic chronic kidney disease: Secondary | ICD-10-CM

## 2023-09-28 DIAGNOSIS — I10 Essential (primary) hypertension: Secondary | ICD-10-CM | POA: Diagnosis not present

## 2023-09-28 DIAGNOSIS — E785 Hyperlipidemia, unspecified: Secondary | ICD-10-CM

## 2023-09-28 DIAGNOSIS — I70219 Atherosclerosis of native arteries of extremities with intermittent claudication, unspecified extremity: Secondary | ICD-10-CM

## 2023-09-28 DIAGNOSIS — I6522 Occlusion and stenosis of left carotid artery: Secondary | ICD-10-CM | POA: Diagnosis not present

## 2023-09-28 NOTE — Assessment & Plan Note (Signed)
No new symptoms. To be checked later this year. 

## 2023-09-28 NOTE — Assessment & Plan Note (Signed)
 ABIs today are improved to 1.12 on the right and 0.59 on the left.  He has been walking much more.  His claudication is not currently lifestyle limiting.  He does notice it, but it does not keep him from doing his normal activities.  No role for intervention.  Will plan on checking ABIs in 6 to 9 months to try to line up with his carotid follow-up.  Continue dual antiplatelet therapy and statin agent.

## 2023-09-28 NOTE — Progress Notes (Signed)
 MRN : [id number]  Charles Ibarra is a 58 y.o. (May 06, 1965) male who presents with chief complaint of  Chief Complaint  Patient presents with   Follow-up  .  History of Present Illness: Patient returns today in follow up of his peripheral arterial disease.  He still has some claudication symptoms but these are not currently lifestyle limiting.  He denies any ulceration or rest pain.  He has been walking more.  At his last visit, he had not been able to walk as much due to cardiac issues. ABIs today are improved to 1.12 on the right and 0.59 on the left.    Current Outpatient Medications  Medication Sig Dispense Refill   aspirin  EC 81 MG tablet Take 81 mg by mouth daily.     atorvastatin  (LIPITOR) 80 MG tablet Take 80 mg by mouth at bedtime.     carvedilol  (COREG ) 25 MG tablet Take 12.5 mg by mouth 2 (two) times daily with a meal.     clopidogrel  (PLAVIX ) 75 MG tablet Take 1 tablet by mouth once daily 90 tablet 1   colchicine 0.6 MG tablet Take 0.6 mg by mouth daily as needed (Gout).     cyanocobalamin (VITAMIN B12) 1000 MCG tablet Take 1,000 mcg by mouth daily. (Patient not taking: Reported on 06/22/2023)     ezetimibe (ZETIA) 10 MG tablet Take 10 mg by mouth daily.     losartan  (COZAAR ) 100 MG tablet Take 100 mg by mouth daily.     nitroGLYCERIN  (NITROSTAT ) 0.3 MG SL tablet Place 1 tablet (0.3 mg total) under the tongue every 5 (five) minutes as needed for chest pain. 100 tablet 3   tadalafil (CIALIS) 20 MG tablet Take 20 mg by mouth daily as needed for erectile dysfunction.     torsemide  (DEMADEX ) 20 MG tablet Take 20 mg by mouth daily.     No current facility-administered medications for this visit.    Past Medical History:  Diagnosis Date   Alcohol abuse    Aortic stenosis    Aortic stenosis    Atherosclerotic PVD with intermittent claudication (HCC)    Chronic kidney disease, stage 3 (moderate)    Coronary artery disease involving native coronary artery of native heart  with angina pectoris (HCC)    Diabetes mellitus without complication (HCC)    HTN (hypertension)    Hyperlipidemia    Ischemic cardiomyopathy    S/P CABG (coronary artery bypass graft)     Past Surgical History:  Procedure Laterality Date   BACK SURGERY     CAROTID ANGIOGRAPHY Right 12/26/2021   Procedure: CAROTID ANGIOGRAPHY;  Surgeon: Marea Selinda RAMAN, MD;  Location: [facility];  Service: Cardiovascular;  Laterality: Right;   CAROTID PTA/STENT INTERVENTION Left 02/10/2021   Procedure: CAROTID PTA/STENT INTERVENTION;  Surgeon: Marea Selinda RAMAN, MD;  Location: [facility];  Service: Cardiovascular;  Laterality: Left;   COLONOSCOPY     COLONOSCOPY N/A 03/09/2022   Procedure: COLONOSCOPY;  Surgeon: Onita Elspeth Sharper, DO;  Location: [facility];  Service: Gastroenterology;  Laterality: N/A;   CORONARY ARTERY BYPASS GRAFT     LEFT HEART CATH AND CORS/GRAFTS ANGIOGRAPHY Left 05/30/2023   Procedure: LEFT HEART CATH AND CORS/GRAFTS ANGIOGRAPHY;  Surgeon: Katina Albright, MD;  Location: [facility];  Service: Cardiovascular;  Laterality: Left;   LOWER EXTREMITY ANGIOGRAPHY Right 07/11/2021   Procedure: Lower Extremity Angiography;  Surgeon: Marea Selinda RAMAN, MD;  Location: [facility];  Service: Cardiovascular;  Laterality: Right;   NASAL FRACTURE SURGERY     TONSILLECTOMY     WISDOM TOOTH EXTRACTION       Social History   Tobacco Use   Smoking status: Former    Current packs/day: 0.00    Types: Cigarettes    Quit date: 04/02/2010    Years since quitting: 13.4   Smokeless tobacco: Never  Vaping Use   Vaping status: Never Used  Substance Use Topics   Alcohol use: Yes    Alcohol/week: 18.0 standard drinks of alcohol    Types: 18 Cans of beer per week    Comment: 6-7 beers on weekends   Drug use: Not Currently    Comment: CBD (smokable )      Family History  Problem Relation Age of Onset   Heart disease Mother    Heart disease Father   No bleeding or  clotting disorders  No Known Allergies    REVIEW OF SYSTEMS (Negative unless checked)   Constitutional: [] Weight loss  [] Fever  [] Chills Cardiac: [] Chest pain   [] Chest pressure   [] Palpitations   [] Shortness of breath when laying flat   [] Shortness of breath at rest   [x] Shortness of breath with exertion. Vascular:  [] Pain in legs with walking   [] Pain in legs at rest   [] Pain in legs when laying flat   [x] Claudication   [] Pain in feet when walking  [] Pain in feet at rest  [] Pain in feet when laying flat   [] History of DVT   [] Phlebitis   [] Swelling in legs   [] Varicose veins   [] Non-healing ulcers Pulmonary:   [] Uses home oxygen   [] Productive cough   [] Hemoptysis   [] Wheeze  [] COPD   [] Asthma Neurologic:  [] Dizziness  [] Blackouts   [] Seizures   [] History of stroke   [] History of TIA  [] Aphasia   [x] Temporary blindness   [] Dysphagia   [] Weakness or numbness in arms   [] Weakness or numbness in legs Musculoskeletal:  [] Arthritis   [] Joint swelling   [] Joint pain   [] Low back pain Hematologic:  [] Easy bruising  [] Easy bleeding   [] Hypercoagulable state   [] Anemic  [] Hepatitis Gastrointestinal:  [] Blood in stool   [] Vomiting blood  [] Gastroesophageal reflux/heartburn   [] Abdominal pain Genitourinary:  [x] Chronic kidney disease   [] Difficult urination  [] Frequent urination  [] Burning with urination   [] Hematuria Skin:  [] Rashes   [] Ulcers   [] Wounds Psychological:  [] History of anxiety   []  History of major depression.   Physical Examination  BP 139/80   Pulse (!) 58   Ht 6' (1.829 m)   Wt 230 lb 8 oz (104.6 kg)   BMI 31.26 kg/m  Gen:  WD/WN, NAD Head: [place]/AT, No temporalis wasting. Ear/Nose/Throat: Hearing grossly intact, nares w/o erythema or drainage Eyes: Conjunctiva clear. Sclera non-icteric Neck: Supple.  Trachea midline Pulmonary:  Good air movement, no use of accessory muscles.  Cardiac: bradycardic Vascular:  Vessel Right Left  Radial Palpable Palpable                           PT 2+ Palpable 1+ Palpable  DP 2+ Palpable 1+ Palpable   Gastrointestinal: soft, non-tender/non-distended. No guarding/reflex.  Musculoskeletal: M/S 5/5 throughout.  No deformity or atrophy. No edema. Neurologic: Sensation grossly intact in extremities.  Symmetrical.  Speech is fluent.  Psychiatric: Judgment intact, Mood & affect appropriate for pt's clinical situation. Dermatologic: No rashes or ulcers noted.  No cellulitis or open wounds.  Labs No results found for this or any previous visit (from the past 2160 hours).  Radiology No results found.  Assessment/Plan  Atherosclerotic PVD with intermittent claudication (HCC) ABIs today are improved to 1.12 on the right and 0.59 on the left.  He has been walking much more.  His claudication is not currently lifestyle limiting.  He does notice it, but it does not keep him from doing his normal activities.  No role for intervention.  Will plan on checking ABIs in 6 to 9 months to try to line up with his carotid follow-up.  Continue dual antiplatelet therapy and statin agent.  Carotid stenosis, left No new symptoms.  To be checked later this year.  Hyperlipidemia lipid control important in reducing the progression of atherosclerotic disease. Continue statin therapy     Essential hypertension blood pressure control important in reducing the progression of atherosclerotic disease. On appropriate oral medications.     Diabetes (HCC) blood glucose control important in reducing the progression of atherosclerotic disease. Also, involved in wound healing. On appropriate medications.  Selinda Gu, MD  09/28/2023 9:11 AM    This note was created with Dragon medical transcription system.  Any errors from dictation are purely unintentional

## 2023-10-01 LAB — VAS US ABI WITH/WO TBI
Left ABI: 0.59
Right ABI: 1.12

## 2023-11-13 ENCOUNTER — Telehealth (INDEPENDENT_AMBULATORY_CARE_PROVIDER_SITE_OTHER): Payer: Self-pay

## 2023-11-13 NOTE — Telephone Encounter (Signed)
 Patient is now scheduled on Monday, 11/19/23 at 7:30am. ABI + DVT US 

## 2023-11-13 NOTE — Telephone Encounter (Signed)
 Patient left voicemail on nurse line stating he was having discomfort/pain in his L leg mostly when sleeping and it is starting to keep him up at night. He is requesting to be seen, but wanted to know if a study was needed at this time. Upon speaking with patient he stated he is not wearing compression stockings at this time as he was instructed to do so, he is elevating when he can, but his leg will not give him any relief at night it just continues to hurt and ache and he has to get out of bed to control the discomfort or constantly reposition. He is afraid he has a blockage. Pain is currently a 7/10 when walking and a 3/10 when the pain is managed. Please advise.

## 2023-11-16 ENCOUNTER — Other Ambulatory Visit (INDEPENDENT_AMBULATORY_CARE_PROVIDER_SITE_OTHER): Payer: Self-pay | Admitting: Vascular Surgery

## 2023-11-16 DIAGNOSIS — M79605 Pain in left leg: Secondary | ICD-10-CM

## 2023-11-19 ENCOUNTER — Ambulatory Visit (INDEPENDENT_AMBULATORY_CARE_PROVIDER_SITE_OTHER)

## 2023-11-19 ENCOUNTER — Encounter (INDEPENDENT_AMBULATORY_CARE_PROVIDER_SITE_OTHER): Payer: Self-pay | Admitting: Nurse Practitioner

## 2023-11-19 ENCOUNTER — Ambulatory Visit (INDEPENDENT_AMBULATORY_CARE_PROVIDER_SITE_OTHER): Admitting: Nurse Practitioner

## 2023-11-19 VITALS — BP 143/68 | HR 48 | Resp 18 | Ht 72.0 in | Wt 230.6 lb

## 2023-11-19 DIAGNOSIS — M79605 Pain in left leg: Secondary | ICD-10-CM

## 2023-11-19 DIAGNOSIS — M5442 Lumbago with sciatica, left side: Secondary | ICD-10-CM | POA: Diagnosis not present

## 2023-11-19 DIAGNOSIS — E1122 Type 2 diabetes mellitus with diabetic chronic kidney disease: Secondary | ICD-10-CM | POA: Diagnosis not present

## 2023-11-19 DIAGNOSIS — G8929 Other chronic pain: Secondary | ICD-10-CM

## 2023-11-19 DIAGNOSIS — M5441 Lumbago with sciatica, right side: Secondary | ICD-10-CM

## 2023-11-19 DIAGNOSIS — I70222 Atherosclerosis of native arteries of extremities with rest pain, left leg: Secondary | ICD-10-CM

## 2023-11-19 DIAGNOSIS — E785 Hyperlipidemia, unspecified: Secondary | ICD-10-CM

## 2023-11-19 DIAGNOSIS — N183 Chronic kidney disease, stage 3 unspecified: Secondary | ICD-10-CM

## 2023-11-19 NOTE — Progress Notes (Signed)
 Subjective:    Patient ID: Charles Ibarra, male    DOB: 04/30/1965, 58 y.o.   MRN: [id number] Chief Complaint  Patient presents with   Follow-up    fu + ABI + DVT Left leg hurting at night when in certain position calves hurt with walking    The patient returns today for follow-up due to worsening pain in his lower extremities.  He does have a history of known peripheral arterial disease but he notes that the symptoms were suddenly worsened.  He notes that he began to have pain at night with pain radiating down his left lower extremity down to his foot.  He is not sure if this is related to his sciatica or his blood flow.  He denies any significant swelling of the lower extremities.  He denies any open wounds or ulcerations.  He also notes that his claudication distance has been worsening as well.  Today he has an ABI 1.15 on the right and 0.63 on the left.  He has a decreased TBI from previous and he has monophasic waveforms today in the left lower extremity.    Review of Systems  Cardiovascular:  Negative for leg swelling.       Worsening claudication  All other systems reviewed and are negative.      Objective:   Physical Exam Vitals reviewed.  HENT:     Head: Normocephalic.  Cardiovascular:     Rate and Rhythm: Normal rate.     Pulses:          Dorsalis pedis pulses are detected w/ Doppler on the right side and detected w/ Doppler on the left side.       Posterior tibial pulses are detected w/ Doppler on the right side and detected w/ Doppler on the left side.  Pulmonary:     Effort: Pulmonary effort is normal.  Skin:    General: Skin is warm and dry.  Neurological:     Mental Status: He is alert and oriented to person, place, and time.     Gait: Gait abnormal.  Psychiatric:        Mood and Affect: Mood normal.        Behavior: Behavior normal.        Thought Content: Thought content normal.        Judgment: Judgment normal.     BP (!) 143/68   Pulse (!) 48    Resp 18   Ht 6' (1.829 m)   Wt 230 lb 9.6 oz (104.6 kg)   BMI 31.27 kg/m   Past Medical History:  Diagnosis Date   Alcohol abuse    Aortic stenosis    Aortic stenosis    Atherosclerotic PVD with intermittent claudication    Chronic kidney disease, stage 3 (moderate)    Coronary artery disease involving native coronary artery of native heart with angina pectoris    Diabetes mellitus without complication (HCC)    HTN (hypertension)    Hyperlipidemia    Ischemic cardiomyopathy    S/P CABG (coronary artery bypass graft)     Social History   Socioeconomic History   Marital status: Divorced    Spouse name: Not on file   Number of children: 1   Years of education: Not on file   Highest education level: Not on file  Occupational History   Not on file  Tobacco Use   Smoking status: Former    Current packs/day: 0.00    Types: Cigarettes  Quit date: 04/02/2010    Years since quitting: 13.6   Smokeless tobacco: Never  Vaping Use   Vaping status: Never Used  Substance and Sexual Activity   Alcohol use: Yes    Alcohol/week: 18.0 standard drinks of alcohol    Types: 18 Cans of beer per week    Comment: 6-7 beers on weekends   Drug use: Not Currently    Comment: CBD (smokable )   Sexual activity: Not Currently  Other Topics Concern   Not on file  Social History Narrative   Daughter , who lives in [place], [place]   Social Drivers of Health   Financial Resource Strain: Low Risk  (04/19/2023)   Received from [facility]   Overall Financial Resource Strain (CARDIA)    Difficulty of Paying Living Expenses: Not hard at all  Food Insecurity: No Food Insecurity (04/19/2023)   Received from [facility]   Hunger Vital Sign    Within the past 12 months, you worried that your food would run out before you got the money to buy more.: Never true    Within the past 12 months, the food you bought just didn't last and you didn't have money to get more.:  Never true  Transportation Needs: No Transportation Needs (04/19/2023)   Received from [facility] - Transportation    In the past 12 months, has lack of transportation kept you from medical appointments or from getting medications?: No    Lack of Transportation (Non-Medical): No  Physical Activity: Not on file  Stress: Not on file  Social Connections: Unknown (06/07/2021)   Received from [facility]   Social Network    Social Network: Not on file  Intimate Partner Violence: Unknown (04/29/2021)   Received from [facility]   HITS    Physically Hurt: Not on file    Insult or Talk Down To: Not on file    Threaten Physical Harm: Not on file    Scream or Curse: Not on file    Past Surgical History:  Procedure Laterality Date   BACK SURGERY     CAROTID ANGIOGRAPHY Right 12/26/2021   Procedure: CAROTID ANGIOGRAPHY;  Surgeon: Marea Selinda RAMAN, MD;  Location: [facility] INVASIVE CV LAB;  Service: Cardiovascular;  Laterality: Right;   CAROTID PTA/STENT INTERVENTION Left 02/10/2021   Procedure: CAROTID PTA/STENT INTERVENTION;  Surgeon: Marea Selinda RAMAN, MD;  Location: [facility] INVASIVE CV LAB;  Service: Cardiovascular;  Laterality: Left;   COLONOSCOPY     COLONOSCOPY N/A 03/09/2022   Procedure: COLONOSCOPY;  Surgeon: Onita Elspeth Sharper, DO;  Location: [facility] ENDOSCOPY;  Service: Gastroenterology;  Laterality: N/A;   CORONARY ARTERY BYPASS GRAFT     LEFT HEART CATH AND CORS/GRAFTS ANGIOGRAPHY Left 05/30/2023   Procedure: LEFT HEART CATH AND CORS/GRAFTS ANGIOGRAPHY;  Surgeon: Katina Albright, MD;  Location: [facility] INVASIVE CV LAB;  Service: Cardiovascular;  Laterality: Left;   LOWER EXTREMITY ANGIOGRAPHY Right 07/11/2021   Procedure: Lower Extremity Angiography;  Surgeon: Marea Selinda RAMAN, MD;  Location: [facility] INVASIVE CV LAB;  Service: Cardiovascular;  Laterality: Right;   NASAL FRACTURE SURGERY     TONSILLECTOMY     WISDOM TOOTH EXTRACTION      Family History  Problem Relation Age of Onset    Heart disease Mother    Heart disease Father     No Known Allergies     Latest Ref Rng & Units 06/08/2023    8:32 AM 02/11/2021  5:22 AM  CBC  WBC 4.0 - 10.5 K/uL 5.1  5.7   Hemoglobin 13.0 - 17.0 g/dL 87.5  89.3   Hematocrit 39.0 - 52.0 % 35.5  31.7   Platelets 150 - 400 K/uL 154  118       CMP     Component Value Date/Time   NA 135 06/08/2023 0832   K 4.2 06/08/2023 0832   CL 104 06/08/2023 0832   CO2 25 06/08/2023 0832   GLUCOSE 204 (H) 06/08/2023 0832   BUN 17 06/08/2023 0832   CREATININE 1.25 (H) 06/08/2023 0832   CALCIUM  9.0 06/08/2023 0832   GFRNONAA >60 06/08/2023 0832     VAS US  ABI WITH/WO TBI Result Date: 10/01/2023  LOWER EXTREMITY DOPPLER STUDY Patient Name:  OSHAE SIMMERING  Date of Exam:   09/28/2023 Medical Rec #: [id number]         Accession #:    [id number] Date of Birth: [birth year]        Patient Gender: M Patient Age:   51 years Exam Location:  [facility] Procedure:      VAS US  ABI WITH/WO TBI Referring Phys: BRIEN PACE --------------------------------------------------------------------------------  Indications: Claudication, and peripheral artery disease. High Risk Factors: Hypertension, coronary artery disease. Other Factors: Patient reports left leg claudication symptoms are improving with                sustained exercise.  Vascular Interventions: 07/11/2021: Aortogram and Selctive Right Lower Extremity                         Angiogram. PTA of the Right SFA and above knee Popliteal                         Artery with 5 mm diameter Lutonix drug coated                         angioplasty balloon distally. Viabahn Stent placement x2                         with a pair of 6 mm diameter by 25 cm length stents in                         the Right SFA and Popliteal Artery. Comparison Study: 06/29/2023 Performing Technologist: Leafy Gibes RVS  Examination Guidelines: A complete evaluation includes at minimum, Doppler waveform signals and systolic  blood pressure reading at the level of bilateral brachial, anterior tibial, and posterior tibial arteries, when vessel segments are accessible. Bilateral testing is considered an integral part of a complete examination. Photoelectric Plethysmograph (PPG) waveforms and toe systolic pressure readings are included as required and additional duplex testing as needed. Limited examinations for reoccurring indications may be performed as noted.  ABI Findings: +---------+------------------+-----+---------+--------+ Right    Rt Pressure (mmHg)IndexWaveform Comment  +---------+------------------+-----+---------+--------+ Brachial 160                                      +---------+------------------+-----+---------+--------+ ATA      171               1.04 triphasic         +---------+------------------+-----+---------+--------+ PTA      185  1.12 triphasic         +---------+------------------+-----+---------+--------+ Great Toe162               0.98 Normal            +---------+------------------+-----+---------+--------+ +---------+------------------+-----+--------+-------+ Left     Lt Pressure (mmHg)IndexWaveformComment +---------+------------------+-----+--------+-------+ Brachial 165                                    +---------+------------------+-----+--------+-------+ ATA      90                0.55 biphasic        +---------+------------------+-----+--------+-------+ PTA      97                0.59 biphasic        +---------+------------------+-----+--------+-------+ PERO     93                0.58 biphasic        +---------+------------------+-----+--------+-------+ Great Toe120               0.73 Normal          +---------+------------------+-----+--------+-------+ +-------+-----------+-----------+------------+------------+ ABI/TBIToday's ABIToday's TBIPrevious ABIPrevious TBI  +-------+-----------+-----------+------------+------------+ Right  1.12       .98        .95         .77          +-------+-----------+-----------+------------+------------+ Left   .59        .73        .46         .34          +-------+-----------+-----------+------------+------------+ Bilateral TBIs appear increased compared to prior study on 06/29/2023. Compared to prior study on 06/29/2023.  Summary: Right: Resting right ankle-brachial index is within normal range. The right toe-brachial index is normal.  Left: Resting left ankle-brachial index indicates moderate left lower extremity arterial disease. The left toe-brachial index is normal.  Imaging performed of the Left SFA Mid, SFA Distal and Popliteal Artery; Monophasic Flow seen.  *See table(s) above for measurements and observations.  Electronically signed by Selinda Gu MD on 10/01/2023 at 1:11:33 PM.    Final        Assessment & Plan:   1. Atherosclerosis of native artery of left lower extremity with rest pain (HCC) (Primary) Recommend:  The patient has evidence of severe atherosclerotic changes of both lower extremities with rest pain that is associated with preulcerative changes and impending tissue loss of the left foot.  This represents a limb threatening ischemia and places the patient at the risk for left limb loss.  Patient should undergo angiography of the left lower extremity with the hope for intervention for limb salvage.  The risks and benefits as well as the alternative therapies was discussed in detail with the patient.  All questions were answered.  Patient agrees to proceed with left lower extremity angiography.  The patient will follow up with me in the office after the procedure.      2. Hyperlipidemia, unspecified hyperlipidemia type Continue statin as ordered and reviewed, no changes at this time  3. Type 2 diabetes mellitus with stage 3 chronic kidney disease, without long-term current use of insulin,  unspecified whether stage 3a or 3b CKD (HCC) Continue hypoglycemic medications as already ordered, these medications have been reviewed and there are no changes at this time.  Hgb A1C to be  monitored as already arranged by primary service    4. Chronic bilateral low back pain with bilateral sciatica The patient does have some discomfort when laying down at night.  Certainly possible this is rest pain but it is also possibly related to sciatica.  Patient will follow-up with his sciatic specialist if he continues to have issues  Current Outpatient Medications on File Prior to Visit  Medication Sig Dispense Refill   aspirin  EC 81 MG tablet Take 81 mg by mouth daily.     atorvastatin  (LIPITOR) 80 MG tablet Take 80 mg by mouth at bedtime.     carvedilol  (COREG ) 25 MG tablet Take 12.5 mg by mouth 2 (two) times daily with a meal.     clopidogrel  (PLAVIX ) 75 MG tablet Take 1 tablet by mouth once daily 90 tablet 1   colchicine 0.6 MG tablet Take 0.6 mg by mouth daily as needed (Gout).     cyanocobalamin (VITAMIN B12) 1000 MCG tablet Take 1,000 mcg by mouth daily.     ezetimibe (ZETIA) 10 MG tablet Take 10 mg by mouth daily.     losartan  (COZAAR ) 100 MG tablet Take 100 mg by mouth daily.     nitroGLYCERIN  (NITROSTAT ) 0.3 MG SL tablet Place 1 tablet (0.3 mg total) under the tongue every 5 (five) minutes as needed for chest pain. 100 tablet 3   tadalafil (CIALIS) 20 MG tablet Take 20 mg by mouth daily as needed for erectile dysfunction.     torsemide  (DEMADEX ) 20 MG tablet Take 20 mg by mouth daily.     No current facility-administered medications on file prior to visit.    There are no Patient Instructions on file for this visit. No follow-ups on file.   Jeremiah Curci E Kalven Ganim, NP

## 2023-11-21 LAB — VAS US ABI WITH/WO TBI
Left ABI: 0.63
Right ABI: 1.15

## 2023-12-19 ENCOUNTER — Other Ambulatory Visit (INDEPENDENT_AMBULATORY_CARE_PROVIDER_SITE_OTHER): Payer: Self-pay | Admitting: Vascular Surgery

## 2023-12-19 DIAGNOSIS — I6523 Occlusion and stenosis of bilateral carotid arteries: Secondary | ICD-10-CM

## 2023-12-28 ENCOUNTER — Ambulatory Visit (INDEPENDENT_AMBULATORY_CARE_PROVIDER_SITE_OTHER): Admitting: Vascular Surgery

## 2023-12-28 ENCOUNTER — Encounter (INDEPENDENT_AMBULATORY_CARE_PROVIDER_SITE_OTHER)

## 2024-01-10 ENCOUNTER — Encounter (INDEPENDENT_AMBULATORY_CARE_PROVIDER_SITE_OTHER): Payer: Self-pay | Admitting: Nurse Practitioner

## 2024-01-10 ENCOUNTER — Ambulatory Visit (INDEPENDENT_AMBULATORY_CARE_PROVIDER_SITE_OTHER): Admitting: Nurse Practitioner

## 2024-01-10 ENCOUNTER — Other Ambulatory Visit (INDEPENDENT_AMBULATORY_CARE_PROVIDER_SITE_OTHER)

## 2024-01-10 VITALS — BP 151/56 | HR 50 | Resp 18 | Wt 230.0 lb

## 2024-01-10 DIAGNOSIS — I70222 Atherosclerosis of native arteries of extremities with rest pain, left leg: Secondary | ICD-10-CM | POA: Diagnosis not present

## 2024-01-10 DIAGNOSIS — I6523 Occlusion and stenosis of bilateral carotid arteries: Secondary | ICD-10-CM | POA: Diagnosis not present

## 2024-01-10 DIAGNOSIS — I10 Essential (primary) hypertension: Secondary | ICD-10-CM

## 2024-01-10 NOTE — Progress Notes (Signed)
 Subjective:    Patient ID: Charles Ibarra, male    DOB: Dec 10, 1965, 58 y.o.   MRN: [id number] Chief Complaint  Patient presents with   Follow-up    3 month carotid follow up    HPI  Discussed the use of AI scribe software for clinical note transcription with the patient, who gave verbal consent to proceed.  History of Present Illness Charles Ibarra is a 58 year old male with peripheral artery disease who presents for follow-up of leg symptoms and carotid artery evaluation.  He notes improvement in leg symptoms since the last visit, with increased walking frequency helping to reduce pain severity by the end of his walks. He experiences claudication but manages it by increasing his walking, which seems to aid in collateral blood vessel formation. He has not yet received a call regarding the angiogram previously discussed.  He mentions a recent episode of gout that caused joint pain and slowed him down, but it has resolved with colchicine treatment.  Regarding his carotid arteries, the last check was in June 2025. The right carotid artery shows 40-59% stenosis, which has not changed significantly, and the left carotid artery stent remains widely patent with less than 30% stenosis. Vertebral and subclavian arteries are reported to be in good condition.  He has a history of sciatic pain, which he describes as intermittent and bothersome. He prefers not to use gabapentin for this condition and instead manages it with stretching and avoiding ibuprofen due to previous advice.    Results Diagnostic Carotid duplex ultrasound (06/2023): Right carotid artery with 40-59% stenosis; left carotid artery stent widely patent with less than 30% stenosis; vertebral arteries with antegrade flow; subclavian arteries normal   Review of Systems  Cardiovascular:        Claudication  Musculoskeletal:  Positive for arthralgias.  All other systems reviewed and are negative.      Objective:    Physical Exam Vitals reviewed.  HENT:     Head: Normocephalic.  Neck:     Vascular: No carotid bruit.  Cardiovascular:     Rate and Rhythm: Normal rate and regular rhythm.     Pulses: Normal pulses.  Pulmonary:     Effort: Pulmonary effort is normal.  Skin:    General: Skin is warm and dry.  Neurological:     Mental Status: He is alert and oriented to person, place, and time.  Psychiatric:        Mood and Affect: Mood normal.        Behavior: Behavior normal.        Thought Content: Thought content normal.        Judgment: Judgment normal.     Physical Exam    BP (!) 151/56 (BP Location: Left Arm)   Pulse (!) 50   Resp 18   Wt 230 lb (104.3 kg)   BMI 31.19 kg/m   Past Medical History:  Diagnosis Date   Alcohol abuse    Aortic stenosis    Aortic stenosis    Atherosclerotic PVD with intermittent claudication    Chronic kidney disease, stage 3 (moderate)    Coronary artery disease involving native coronary artery of native heart with angina pectoris    Diabetes mellitus without complication (HCC)    HTN (hypertension)    Hyperlipidemia    Ischemic cardiomyopathy    S/P CABG (coronary artery bypass graft)     Social History   Socioeconomic History   Marital status: Divorced  Spouse name: Not on file   Number of children: 1   Years of education: Not on file   Highest education level: Not on file  Occupational History   Not on file  Tobacco Use   Smoking status: Former    Current packs/day: 0.00    Types: Cigarettes    Quit date: 04/02/2010    Years since quitting: 13.7   Smokeless tobacco: Never  Vaping Use   Vaping status: Never Used  Substance and Sexual Activity   Alcohol use: Yes    Alcohol/week: 18.0 standard drinks of alcohol    Types: 18 Cans of beer per week    Comment: 6-7 beers on weekends   Drug use: Not Currently    Comment: CBD (smokable )   Sexual activity: Not Currently  Other Topics Concern   Not on file  Social History  Narrative   Daughter , who lives in [place], [place]   Social Drivers of Health   Tobacco Use: Medium Risk (01/10/2024)   Patient History    Smoking Tobacco Use: Former    Smokeless Tobacco Use: Never    Passive Exposure: Not on Actuary Strain: Low Risk  (04/19/2023)   Received from [facility]   Overall Financial Resource Strain (CARDIA)    Difficulty of Paying Living Expenses: Not hard at all  Food Insecurity: No Food Insecurity (04/19/2023)   Received from [facility]   Epic    Within the past 12 months, you worried that your food would run out before you got the money to buy more.: Never true    Within the past 12 months, the food you bought just didn't last and you didn't have money to get more.: Never true  Transportation Needs: No Transportation Needs (04/19/2023)   Received from [facility] - Transportation    In the past 12 months, has lack of transportation kept you from medical appointments or from getting medications?: No    Lack of Transportation (Non-Medical): No  Physical Activity: Not on file  Stress: Not on file  Social Connections: Unknown (06/07/2021)   Received from [facility]   Social Network    Social Network: Not on file  Intimate Partner Violence: Unknown (04/29/2021)   Received from [facility]   HITS    Physically Hurt: Not on file    Insult or Talk Down To: Not on file    Threaten Physical Harm: Not on file    Scream or Curse: Not on file  Depression (PHQ2-9): Low Risk (06/28/2023)   Depression (PHQ2-9)    PHQ-2 Score: 3  Alcohol Screen: Not on file  Housing: Unknown (04/19/2023)   Received from [facility]   Epic    In the last 12 months, was there a time when you were not able to pay the mortgage or rent on time?: No    Number of Times Moved in the Last Year: Not on file    At any time in the past 12 months, were you homeless or living in a shelter  (including now)?: No  Utilities: Not At Risk (04/19/2023)   Received from [facility] Utilities    Threatened with loss of utilities: No  Health Literacy: Not on file    Past Surgical History:  Procedure Laterality Date   BACK SURGERY     CAROTID ANGIOGRAPHY Right 12/26/2021   Procedure: CAROTID ANGIOGRAPHY;  Surgeon: Marea Selinda RAMAN, MD;  Location: [facility] INVASIVE CV LAB;  Service: Cardiovascular;  Laterality: Right;   CAROTID PTA/STENT INTERVENTION Left 02/10/2021   Procedure: CAROTID PTA/STENT INTERVENTION;  Surgeon: Marea Selinda RAMAN, MD;  Location: [facility] INVASIVE CV LAB;  Service: Cardiovascular;  Laterality: Left;   COLONOSCOPY     COLONOSCOPY N/A 03/09/2022   Procedure: COLONOSCOPY;  Surgeon: Onita Elspeth Sharper, DO;  Location: [facility] ENDOSCOPY;  Service: Gastroenterology;  Laterality: N/A;   CORONARY ARTERY BYPASS GRAFT     LEFT HEART CATH AND CORS/GRAFTS ANGIOGRAPHY Left 05/30/2023   Procedure: LEFT HEART CATH AND CORS/GRAFTS ANGIOGRAPHY;  Surgeon: Katina Albright, MD;  Location: [facility] INVASIVE CV LAB;  Service: Cardiovascular;  Laterality: Left;   LOWER EXTREMITY ANGIOGRAPHY Right 07/11/2021   Procedure: Lower Extremity Angiography;  Surgeon: Marea Selinda RAMAN, MD;  Location: [facility] INVASIVE CV LAB;  Service: Cardiovascular;  Laterality: Right;   NASAL FRACTURE SURGERY     TONSILLECTOMY     WISDOM TOOTH EXTRACTION      Family History  Problem Relation Age of Onset   Heart disease Mother    Heart disease Father     Allergies[1]     Latest Ref Rng & Units 06/08/2023    8:32 AM 02/11/2021    5:22 AM  CBC  WBC 4.0 - 10.5 K/uL 5.1  5.7   Hemoglobin 13.0 - 17.0 g/dL 87.5  89.3   Hematocrit 39.0 - 52.0 % 35.5  31.7   Platelets 150 - 400 K/uL 154  118       CMP     Component Value Date/Time   NA 135 06/08/2023 0832   K 4.2 06/08/2023 0832   CL 104 06/08/2023 0832   CO2 25 06/08/2023 0832   GLUCOSE 204 (H) 06/08/2023 0832   BUN 17 06/08/2023 0832   CREATININE 1.25  (H) 06/08/2023 0832   CALCIUM  9.0 06/08/2023 0832   GFRNONAA >60 06/08/2023 0832     VAS US  ABI WITH/WO TBI Result Date: 11/21/2023  LOWER EXTREMITY DOPPLER STUDY Patient Name:  Charles Ibarra  Date of Exam:   11/19/2023 Medical Rec #: [id number]         Accession #:    [id number] Date of Birth: 1965-09-24        Patient Gender: M Patient Age:   70 years Exam Location:  [facility] Procedure:      VAS US  ABI WITH/WO TBI Referring Phys: Selinda Marea --------------------------------------------------------------------------------  Indications: Claudication, and peripheral artery disease. High Risk Factors: Hypertension, coronary artery disease. Other Factors: Patient reports left leg claudication symptoms are improving with                sustained exercise.  Vascular Interventions: 07/11/2021: Aortogram and Selctive Right Lower Extremity                         Angiogram. PTA of the Right SFA and above knee Popliteal                         Artery with 5 mm diameter Lutonix drug coated                         angioplasty balloon distally. Viabahn Stent placement x2                         with a pair of  6 mm diameter by 25 cm length stents in                         the Right SFA and Popliteal Artery. Comparison Study: 09/28/2023 Performing Technologist: Leafy Gibes RVS  Examination Guidelines: A complete evaluation includes at minimum, Doppler waveform signals and systolic blood pressure reading at the level of bilateral brachial, anterior tibial, and posterior tibial arteries, when vessel segments are accessible. Bilateral testing is considered an integral part of a complete examination. Photoelectric Plethysmograph (PPG) waveforms and toe systolic pressure readings are included as required and additional duplex testing as needed. Limited examinations for reoccurring indications may be performed as noted.  ABI Findings: +---------+------------------+-----+---------+--------+ Right    Rt  Pressure (mmHg)IndexWaveform Comment  +---------+------------------+-----+---------+--------+ Brachial 155                                      +---------+------------------+-----+---------+--------+ ATA      176               1.14 triphasic         +---------+------------------+-----+---------+--------+ PTA      179               1.15 triphasic         +---------+------------------+-----+---------+--------+ [place]               0.88 Normal            +---------+------------------+-----+---------+--------+ +---------+------------------+-----+----------+-------+ Left     Lt Pressure (mmHg)IndexWaveform  Comment +---------+------------------+-----+----------+-------+ Brachial 152                                      +---------+------------------+-----+----------+-------+ ATA      73                0.47 monophasic        +---------+------------------+-----+----------+-------+ PTA      98                0.63 monophasic        +---------+------------------+-----+----------+-------+ Great Toe87                0.56 Abnormal          +---------+------------------+-----+----------+-------+ +-------+-----------+-----------+------------+------------+ ABI/TBIToday's ABIToday's TBIPrevious ABIPrevious TBI +-------+-----------+-----------+------------+------------+ Right  1.15       .88        1.12        .98          +-------+-----------+-----------+------------+------------+ Left   .63        .56        .59         .73          +-------+-----------+-----------+------------+------------+ Bilateral ABIs appear essentially unchanged compared to prior study on 09/28/2023. Bilateral TBIs appear decreased compared to prior study on 09/28/2023.  Summary: Right: Resting right ankle-brachial index is within normal range. The right toe-brachial index is normal.  Left: Resting left ankle-brachial index indicates moderate left lower extremity arterial  disease. The left toe-brachial index is abnormal.  *See table(s) above for measurements and observations.  Electronically signed by Selinda Gu MD on 11/21/2023 at 1:17:57 PM.    Final        Assessment & Plan:   1. Bilateral carotid artery stenosis (Primary) Bilateral carotid artery  stenosis Right carotid artery shows 40-59% stenosis, not significant enough for intervention. Left carotid artery stent is widely patent with less than 30% stenosis. Vertebral and subclavian arteries are functioning well. - Continue current management as carotid stenosis is well-managed. - Scheduled follow-up in six months to reassess carotid status. - VAS US  CAROTID; Future  2. Atherosclerosis of native artery of left lower extremity with rest pain (HCC) Atherosclerosis of native artery of left lower extremity with rest pain Improvement in leg symptoms with increased walking, likely due to collateralization. No current need for intervention as symptoms are well-managed. Discussed potential for worsening symptoms, such as decreased walking distance or increased pain, which would necessitate further evaluation. - Continue walking and physical activity to promote collateral circulation. - Monitor for worsening symptoms such as decreased walking distance or increased pain, especially at night. - Scheduled follow-up in six months to reassess leg symptoms. - VAS US  ABI WITH/WO TBI; Future  3. Essential hypertension Continue antihypertensive medications as already ordered, these medications have been reviewed and there are no changes at this time.   Assessment and Plan Assessment & Plan        Medications Ordered Prior to Encounter[2]  There are no Patient Instructions on file for this visit. Return in about 6 months (around 07/10/2024) for with carotid and abi, see JD/FB.   Billiejean Schimek E Braylen Staller, NP       [1] No Known Allergies [2]  Current Outpatient Medications on File Prior to Visit  Medication Sig  Dispense Refill   aspirin  EC 81 MG tablet Take 81 mg by mouth daily.     atorvastatin  (LIPITOR) 80 MG tablet Take 80 mg by mouth at bedtime.     carvedilol  (COREG ) 25 MG tablet Take 12.5 mg by mouth 2 (two) times daily with a meal.     clopidogrel  (PLAVIX ) 75 MG tablet Take 1 tablet by mouth once daily 90 tablet 1   colchicine 0.6 MG tablet Take 0.6 mg by mouth daily as needed (Gout).     cyanocobalamin (VITAMIN B12) 1000 MCG tablet Take 1,000 mcg by mouth daily.     ezetimibe (ZETIA) 10 MG tablet Take 10 mg by mouth daily.     losartan  (COZAAR ) 100 MG tablet Take 100 mg by mouth daily.     nitroGLYCERIN  (NITROSTAT ) 0.3 MG SL tablet Place 1 tablet (0.3 mg total) under the tongue every 5 (five) minutes as needed for chest pain. 100 tablet 3   tadalafil (CIALIS) 20 MG tablet Take 20 mg by mouth daily as needed for erectile dysfunction.     torsemide  (DEMADEX ) 20 MG tablet Take 20 mg by mouth daily.     No current facility-administered medications on file prior to visit.

## 2024-06-27 ENCOUNTER — Encounter (INDEPENDENT_AMBULATORY_CARE_PROVIDER_SITE_OTHER)

## 2024-06-27 ENCOUNTER — Ambulatory Visit (INDEPENDENT_AMBULATORY_CARE_PROVIDER_SITE_OTHER): Admitting: Vascular Surgery

## 2024-07-10 ENCOUNTER — Ambulatory Visit (INDEPENDENT_AMBULATORY_CARE_PROVIDER_SITE_OTHER): Admitting: Nurse Practitioner

## 2024-07-10 ENCOUNTER — Encounter (INDEPENDENT_AMBULATORY_CARE_PROVIDER_SITE_OTHER)
